# Patient Record
Sex: Female | Born: 1990 | Race: Asian | Hispanic: No | Marital: Single | Smoking: Never smoker
Health system: Southern US, Community
[De-identification: ages and names within clinical notes are randomized; demographics above are authoritative.]

## PROBLEM LIST (undated history)

## (undated) DIAGNOSIS — J45909 Unspecified asthma, uncomplicated: Secondary | ICD-10-CM

## (undated) DIAGNOSIS — E7800 Pure hypercholesterolemia, unspecified: Secondary | ICD-10-CM

## (undated) HISTORY — DX: Pure hypercholesterolemia, unspecified: E78.00

## (undated) HISTORY — PX: EYE SURGERY: SHX253

## (undated) HISTORY — PX: OTHER SURGICAL HISTORY: SHX169

## (undated) HISTORY — PX: WISDOM TOOTH EXTRACTION: SHX21

---
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
## 2012-05-10 ENCOUNTER — Emergency Department (HOSPITAL_COMMUNITY)
Admission: EM | Admit: 2012-05-10 | Discharge: 2012-05-10 | Disposition: A | Discharge status: Home / Self Care | Payer: No Typology Code available for payment source | Attending: Emergency Medicine | Admitting: Emergency Medicine

## 2012-05-10 ENCOUNTER — Emergency Department (HOSPITAL_COMMUNITY): Payer: No Typology Code available for payment source

## 2012-05-10 DIAGNOSIS — S139XXA Sprain of joints and ligaments of unspecified parts of neck, initial encounter: Secondary | ICD-10-CM | POA: Insufficient documentation

## 2012-05-10 DIAGNOSIS — Y92410 Unspecified street and highway as the place of occurrence of the external cause: Secondary | ICD-10-CM | POA: Insufficient documentation

## 2012-05-10 DIAGNOSIS — S335XXA Sprain of ligaments of lumbar spine, initial encounter: Secondary | ICD-10-CM | POA: Insufficient documentation

## 2012-05-10 DIAGNOSIS — S161XXA Strain of muscle, fascia and tendon at neck level, initial encounter: Secondary | ICD-10-CM

## 2012-05-10 DIAGNOSIS — Y9389 Activity, other specified: Secondary | ICD-10-CM | POA: Insufficient documentation

## 2012-05-10 DIAGNOSIS — S39012A Strain of muscle, fascia and tendon of lower back, initial encounter: Secondary | ICD-10-CM

## 2012-05-10 MED ORDER — IBUPROFEN 800 MG PO TABS: 800.0000 mg | ORAL_TABLET | Freq: Three times a day (TID) | ORAL | Status: DC | PRN

## 2012-05-10 MED ORDER — HYDROCODONE-ACETAMINOPHEN 5-325 MG PO TABS: 1.0000 | ORAL_TABLET | Freq: Four times a day (QID) | ORAL | Status: DC | PRN
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
## 2012-05-10 NOTE — ED Provider Notes (Signed)
Medical screening examination/treatment/procedure(s) were performed by non-physician practitioner and as supervising physician I was immediately available for consultation/collaboration.  Madoc Holquin, MD 05/10/12 2112 
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
## 2016-02-02 ENCOUNTER — Emergency Department (HOSPITAL_COMMUNITY)
Admission: EM | Admit: 2016-02-02 | Discharge: 2016-02-02 | Disposition: A | Discharge status: Home / Self Care | Payer: Managed Care, Other (non HMO) | Attending: Emergency Medicine | Admitting: Emergency Medicine

## 2016-02-02 ENCOUNTER — Encounter (HOSPITAL_COMMUNITY): Payer: Self-pay | Admitting: *Deleted

## 2016-02-02 ENCOUNTER — Emergency Department (HOSPITAL_COMMUNITY): Payer: Managed Care, Other (non HMO)

## 2016-02-02 DIAGNOSIS — R109 Unspecified abdominal pain: Secondary | ICD-10-CM | POA: Diagnosis present

## 2016-02-02 DIAGNOSIS — R1032 Left lower quadrant pain: Secondary | ICD-10-CM | POA: Insufficient documentation

## 2016-02-02 HISTORY — DX: Unspecified asthma, uncomplicated: J45.909

## 2016-02-02 LAB — COMPREHENSIVE METABOLIC PANEL
ALBUMIN: 4.3 g/dL (ref 3.5–5.0)
ALK PHOS: 53 U/L (ref 38–126)
ALT: 20 U/L (ref 14–54)
ANION GAP: 11 (ref 5–15)
AST: 23 U/L (ref 15–41)
BUN: 12 mg/dL (ref 6–20)
CALCIUM: 9 mg/dL (ref 8.9–10.3)
CO2: 21 mmol/L — AB (ref 22–32)
Chloride: 103 mmol/L (ref 101–111)
Creatinine, Ser: 0.7 mg/dL (ref 0.44–1.00)
GFR calc Af Amer: >60 mL/min (ref >60)
GFR calc non Af Amer: >60 mL/min (ref >60)
GLUCOSE: 93 mg/dL (ref 65–99)
POTASSIUM: 3.3 mmol/L — AB (ref 3.5–5.1)
SODIUM: 135 mmol/L (ref 135–145)
Total Bilirubin: 0.5 mg/dL (ref 0.3–1.2)
Total Protein: 7.1 g/dL (ref 6.5–8.1)

## 2016-02-02 LAB — CBC WITH DIFFERENTIAL/PLATELET
Basophils Absolute: 0 10*3/uL (ref 0.0–0.1)
Basophils Relative: 0 %
Eosinophils Absolute: 0.3 10*3/uL (ref 0.0–0.7)
Eosinophils Relative: 3 %
HEMATOCRIT: 40 % (ref 36.0–46.0)
HEMOGLOBIN: 13.5 g/dL (ref 12.0–15.0)
LYMPHS ABS: 4.1 10*3/uL — AB (ref 0.7–4.0)
Lymphocytes Relative: 42 %
MCH: 28.5 pg (ref 26.0–34.0)
MCHC: 33.8 g/dL (ref 30.0–36.0)
MCV: 84.4 fL (ref 78.0–100.0)
MONOS PCT: 6 %
Monocytes Absolute: 0.6 10*3/uL (ref 0.1–1.0)
NEUTROS ABS: 4.7 10*3/uL (ref 1.7–7.7)
NEUTROS PCT: 49 %
Platelets: 259 10*3/uL (ref 150–400)
RBC: 4.74 MIL/uL (ref 3.87–5.11)
RDW: 12.1 % (ref 11.5–15.5)
WBC: 9.7 10*3/uL (ref 4.0–10.5)

## 2016-02-02 LAB — LIPASE, BLOOD: Lipase: 29 U/L (ref 11–51)

## 2016-02-02 LAB — URINALYSIS, ROUTINE W REFLEX MICROSCOPIC
BILIRUBIN URINE: NEGATIVE
Glucose, UA: NEGATIVE mg/dL
HGB URINE DIPSTICK: NEGATIVE
Ketones, ur: NEGATIVE mg/dL
Leukocytes, UA: NEGATIVE
Nitrite: NEGATIVE
PH: 6.5 (ref 5.0–8.0)
Protein, ur: NEGATIVE mg/dL
SPECIFIC GRAVITY, URINE: 1.015 (ref 1.005–1.030)

## 2016-02-02 LAB — POC URINE PREG, ED: Preg Test, Ur: NEGATIVE

## 2016-02-02 MED ORDER — SODIUM CHLORIDE 0.9 % IV SOLN
1000.0000 mL | Freq: Once | INTRAVENOUS | Status: AC
  Administered 2016-02-02: 1000 mL via INTRAVENOUS

## 2016-02-02 MED ORDER — ONDANSETRON HCL 4 MG PO TABS: 4.0000 mg | ORAL_TABLET | Freq: Four times a day (QID) | ORAL | 0 refills | Status: DC | PRN

## 2016-02-02 MED ORDER — OXYCODONE-ACETAMINOPHEN 5-325 MG PO TABS: 1.0000 | ORAL_TABLET | ORAL | 0 refills | Status: DC | PRN

## 2016-02-02 MED ORDER — MORPHINE SULFATE (PF) 4 MG/ML IV SOLN
4.0000 mg | Freq: Once | INTRAVENOUS | Status: AC
  Administered 2016-02-02: 4 mg via INTRAVENOUS
  Filled 2016-02-02: qty 1

## 2016-02-02 MED ORDER — SODIUM CHLORIDE 0.9 % IV SOLN
1000.0000 mL | INTRAVENOUS | Status: DC
  Administered 2016-02-02: 1000 mL via INTRAVENOUS

## 2016-02-02 MED ORDER — ONDANSETRON HCL 4 MG/2ML IJ SOLN
4.0000 mg | Freq: Once | INTRAMUSCULAR | Status: AC
  Administered 2016-02-02: 4 mg via INTRAVENOUS
  Filled 2016-02-02: qty 2

## 2016-02-02 NOTE — ED Notes (Signed)
The pt is writhing on the stretcher in pain she  Has epigastric pain

## 2016-02-02 NOTE — ED Notes (Signed)
The pts pain is much better  

## 2016-02-02 NOTE — ED Triage Notes (Signed)
The pt has had abd and flank pain since 0300am with  lmp 3 weeks

## 2016-02-02 NOTE — ED Provider Notes (Signed)
[facility]-EMERGENCY DEPT Provider Note   CSN: [id number] Arrival date & time: 02/02/16  0341  By signing my name below, I, Clovis Pu, attest that this documentation has been prepared under the direction and in the presence of Dione Booze, MD  Electronically Signed: Clovis Pu, ED Scribe. 02/02/16. 4:03 AM.    History   Chief Complaint Chief Complaint  Patient presents with  . Abdominal Pain    The history is provided by the patient. No language interpreter was used.   HPI Comments:  Jamie Dominguez is a 25 y.o. female who presents to the Emergency Department complaining of mid abdominal pain onset 3:10AM today. Pt notes associated nausea and vomiting. She states the pain is exacerbated to the touch. Pt denies chest pain. No alleviating factors noted.   No past medical history on file.  There are no active problems to display for this patient.   No past surgical history on file.  OB History    No data available       Home Medications    Prior to Admission medications   Medication Sig Start Date End Date Taking? Authorizing Provider  cetirizine (ZYRTEC) 10 MG tablet Take 10 mg by mouth daily.    Historical Provider, MD  HYDROcodone-acetaminophen (NORCO/VICODIN) 5-325 MG per tablet Take 1 tablet by mouth every 6 (six) hours as needed for pain. 05/10/12   Charlestine Night, PA-C  ibuprofen (ADVIL,MOTRIN) 800 MG tablet Take 1 tablet (800 mg total) by mouth every 8 (eight) hours as needed for pain. 05/10/12   Charlestine Night, PA-C    Family History No family history on file.  Social History Social History  Substance Use Topics  . Smoking status: Not on file  . Smokeless tobacco: Not on file  . Alcohol use Not on file     Allergies   Review of patient's allergies indicates no known allergies.   Review of Systems Review of Systems  Cardiovascular: Negative for chest pain.  Gastrointestinal: Positive for abdominal pain, nausea and vomiting.  All other systems  reviewed and are negative.    Physical Exam Updated Vital Signs BP (!) 146/102 (BP Location: Right Arm)   Pulse 72   Temp 98.2 F (36.8 C) (Oral)   Resp 20   Ht 5\' 1"  (1.549 m)   Wt 160 lb (72.6 kg)   SpO2 100%   BMI 30.23 kg/m   Physical Exam  Constitutional: She is oriented to person, place, and time. She appears well-developed and well-nourished.  In obvious pain.  HENT:  Head: Normocephalic and atraumatic.  Eyes: EOM are normal. Pupils are equal, round, and reactive to light.  Neck: Normal range of motion. Neck supple. No JVD present.  Cardiovascular: Normal rate, regular rhythm and normal heart sounds.   No murmur heard. Pulmonary/Chest: Effort normal and breath sounds normal. She has no wheezes. She has no rales. She exhibits no tenderness.  Abdominal: She exhibits no distension and no mass. There is tenderness. There is no rebound and no guarding.  Mild tenderness Left mid and lower abdomen. No rebound or guarding. Mild CVA tenderness. Bowel sounds decreased.  Musculoskeletal: Normal range of motion. She exhibits no edema.  Lymphadenopathy:    She has no cervical adenopathy.  Neurological: She is alert and oriented to person, place, and time. No cranial nerve deficit. She exhibits normal muscle tone. Coordination normal.  Skin: Skin is warm and dry. No rash noted.  Psychiatric: She has a normal mood and affect. Her behavior is  normal. Judgment and thought content normal.  Nursing note and vitals reviewed.    ED Treatments / Results  DIAGNOSTIC STUDIES:  Oxygen Saturation is 100% on RA, normal by my interpretation.    COORDINATION OF CARE:  3:58 AM Discussed treatment plan with pt at bedside and pt agreed to plan.  Labs (all labs ordered are listed, but only abnormal results are displayed) Labs Reviewed  COMPREHENSIVE METABOLIC PANEL - Abnormal; Notable for the following:       Result Value   Potassium 3.3 (*)    CO2 21 (*)    All other components within  normal limits  CBC WITH DIFFERENTIAL/PLATELET - Abnormal; Notable for the following:    Lymphs Abs 4.1 (*)    All other components within normal limits  LIPASE, BLOOD  URINALYSIS, ROUTINE W REFLEX MICROSCOPIC (NOT [facility])  POC URINE PREG, ED    Radiology Ct Renal Stone Study  Result Date: 02/02/2016 CLINICAL DATA:  Acute onset mid abdominal pain radiating to LEFT flank and groin. History of menstrual pain. EXAM: CT ABDOMEN AND PELVIS WITHOUT CONTRAST TECHNIQUE: Multidetector CT imaging of the abdomen and pelvis was performed following the standard protocol without IV contrast. COMPARISON:  None. FINDINGS: LOWER CHEST: Lung bases are clear. The visualized heart size is normal. No pericardial effusion. HEPATOBILIARY: Normal. PANCREAS: Normal. SPLEEN: Normal. ADRENALS/URINARY TRACT: Kidneys are orthotopic, demonstrating normal size and morphology. No nephrolithiasis, hydronephrosis; limited assessment for renal masses on this nonenhanced examination. The unopacified ureters are normal in course and caliber. Urinary bladder is partially distended and unremarkable. Normal adrenal glands. STOMACH/BOWEL: The stomach, small and large bowel are normal in course and caliber without inflammatory changes, the sensitivity may be decreased by lack of enteric contrast. Normal appendix. VASCULAR/LYMPHATIC: Aortoiliac vessels are normal in course and caliber. No lymphadenopathy by CT size criteria. REPRODUCTIVE: Normal. OTHER: No intraperitoneal free fluid or free air. MUSCULOSKELETAL: Non-acute. Mild L5-S1 endplate sclerosis compatible with degenerative disc. IMPRESSION: No urolithiasis, obstructive uropathy nor acute intra-abdominal/ pelvic process. Electronically Signed   By: Awilda Metroourtnay  Bloomer M.D.   On: 02/02/2016 06:14    Procedures Procedures (including critical care time)  Medications Ordered in ED Medications  0.9 %  sodium chloride infusion (0 mLs Intravenous Stopped 02/02/16 0627)    Followed by  0.9  %  sodium chloride infusion (1,000 mLs Intravenous New Bag/Given 02/02/16 [id number])  morphine 4 MG/ML injection 4 mg (4 mg Intravenous Given 02/02/16 0421)  ondansetron (ZOFRAN) injection 4 mg (4 mg Intravenous Given 02/02/16 0421)     Initial Impression / Assessment and Plan / ED Course  I have reviewed the triage vital signs and the nursing notes.  Pertinent labs & imaging results that were available during my care of the patient were reviewed by me and considered in my medical decision making (see chart for details).  Clinical Course   Severe abdominal pain. Exam seems to localize the tenderness to the left side of the abdomen and left flank which is suspicious for renal colic. Old records are reviewed, and she had a negative abdominal ultrasound in 2005. There is been no abdominal imaging since then. She is given IV fluids, morphine, ondansetron and will be sent for renal stone protocol CT scan.  CT is unremarkable. Laboratory workup is unremarkable. Patient is now completely pain-free. Most likely explanation is kidney stone which has passed. She is discharged with prescription for a small number of oxycodone have acetaminophen and ondansetron should pain recur.  Final Clinical Impressions(s) /  ED Diagnoses   Final diagnoses:  Abdominal pain, unspecified abdominal location    New Prescriptions New Prescriptions   ONDANSETRON (ZOFRAN) 4 MG TABLET    Take 1 tablet (4 mg total) by mouth every 6 (six) hours as needed for nausea or vomiting.   OXYCODONE-ACETAMINOPHEN (PERCOCET) 5-325 MG TABLET    Take 1 tablet by mouth every 4 (four) hours as needed for moderate pain.  I personally performed the services described in this documentation, which was scribed in my presence. The recorded information has been reviewed and is accurate.       Dione Booze, MD 02/02/16 [phone]

## 2016-02-02 NOTE — ED Notes (Signed)
Family at bedside. 

## 2016-02-02 NOTE — ED Notes (Signed)
No pain

## 2016-02-02 NOTE — ED Notes (Signed)
The pt has gone to c-t and returned 

## 2016-05-10 DIAGNOSIS — R87612 Low grade squamous intraepithelial lesion on cytologic smear of cervix (LGSIL): Secondary | ICD-10-CM

## 2016-05-10 HISTORY — DX: Low grade squamous intraepithelial lesion on cytologic smear of cervix (LGSIL): R87.612

## 2017-02-08 ENCOUNTER — Other Ambulatory Visit: Payer: Self-pay | Admitting: Family Medicine

## 2017-02-08 ENCOUNTER — Other Ambulatory Visit (HOSPITAL_COMMUNITY)
Admission: RE | Admit: 2017-02-08 | Discharge: 2017-02-08 | Disposition: A | Discharge status: Home / Self Care | Payer: Managed Care, Other (non HMO) | Source: Ambulatory Visit | Attending: Family Medicine | Admitting: Family Medicine

## 2017-02-08 DIAGNOSIS — Z124 Encounter for screening for malignant neoplasm of cervix: Secondary | ICD-10-CM | POA: Insufficient documentation

## 2017-02-11 LAB — CYTOLOGY - PAP: HPV: NOT DETECTED

## 2019-04-29 ENCOUNTER — Ambulatory Visit
Admission: EM | Admit: 2019-04-29 | Discharge: 2019-04-29 | Disposition: A | Discharge status: Home / Self Care | Payer: Managed Care, Other (non HMO)

## 2019-04-29 ENCOUNTER — Other Ambulatory Visit: Payer: Self-pay

## 2019-04-29 DIAGNOSIS — Z20828 Contact with and (suspected) exposure to other viral communicable diseases: Secondary | ICD-10-CM

## 2019-04-29 DIAGNOSIS — R0981 Nasal congestion: Secondary | ICD-10-CM

## 2019-04-29 DIAGNOSIS — Z20822 Contact with and (suspected) exposure to covid-19: Secondary | ICD-10-CM

## 2019-04-29 NOTE — Discharge Instructions (Addendum)
COVID PCR testing ordered. I would like you to quarantine until testing results. You can take over the counter flonase/nasacort to help with nasal congestion/drainage. If experiencing shortness of breath, trouble breathing, go to the emergency department for further evaluation needed. 

## 2019-04-29 NOTE — ED Provider Notes (Signed)
[facility]    CSN: [id number] Arrival date & time: 04/29/19  1333      History   Chief Complaint Chief Complaint  Patient presents with  . Nasal Congestion    HPI Jamie Dominguez is a 28 y.o. female.   28 year old female comes in for 3 day of URI symptoms. Nasal congestion, cough, decrease taste/smell, fatigue. Denies fever, chills, body aches. Denies abdominal pain, nausea, vomiting, diarrhea. Has had some shortness of breath, where she feels like she cannot catch deep breaths. Albuterol with some relief. Positive COVID exposure x 3 days ago.     Past Medical History:  Diagnosis Date  . Asthma     There are no problems to display for this patient.   History reviewed. No pertinent surgical history.  OB History   No obstetric history on file.      Home Medications    Prior to Admission medications   Medication Sig Start Date End Date Taking? Authorizing Provider  albuterol (VENTOLIN HFA) 108 (90 Base) MCG/ACT inhaler Inhale into the lungs every 6 (six) hours as needed for wheezing or shortness of breath.   Yes [provider]  cetirizine (ZYRTEC) 10 MG tablet Take 10 mg by mouth daily.    [provider]    Family History History reviewed. No pertinent family history.  Social History Social History   Tobacco Use  . Smoking status: Never Smoker  . Smokeless tobacco: Never Used  Substance Use Topics  . Alcohol use: Yes  . Drug use: Not on file     Allergies   Patient has no known allergies.   Review of Systems Review of Systems  Reason unable to perform ROS: See HPI as above.     Physical Exam Triage Vital Signs ED Triage Vitals [04/29/19 1413]  Enc Vitals Group     BP 112/72     Pulse Rate 92     Resp 16     Temp 98.2 F (36.8 C)     Temp Source Oral     SpO2 99 %     Weight      Height      Head Circumference      Peak Flow      Pain Score 0     Pain Loc      Pain Edu?      Excl. in [place]?    No  data found.  Updated Vital Signs BP 112/72 (BP Location: Left Arm)   Pulse 92   Temp 98.2 F (36.8 C) (Oral)   Resp 16   LMP 04/24/2019   SpO2 99%   Visual Acuity Right Eye Distance:   Left Eye Distance:   Bilateral Distance:    Right Eye Near:   Left Eye Near:    Bilateral Near:     Physical Exam Constitutional:      General: She is not in acute distress.    Appearance: Normal appearance. She is not ill-appearing, toxic-appearing or diaphoretic.  HENT:     Head: Normocephalic and atraumatic.     Mouth/Throat:     Mouth: Mucous membranes are moist.     Pharynx: Oropharynx is clear. Uvula midline.  Cardiovascular:     Rate and Rhythm: Normal rate and regular rhythm.     Heart sounds: Normal heart sounds. No murmur. No friction rub. No gallop.   Pulmonary:     Effort: Pulmonary effort is normal. No accessory muscle usage, prolonged  expiration, respiratory distress or retractions.     Comments: Speaking in full sentences without difficulty. Lungs clear to auscultation without adventitious lung sounds. Musculoskeletal:     Cervical back: Normal range of motion and neck supple.  Neurological:     General: No focal deficit present.     Mental Status: She is alert and oriented to person, place, and time.    UC Treatments / Results  Labs (all labs ordered are listed, but only abnormal results are displayed) Labs Reviewed  NOVEL CORONAVIRUS, NAA    EKG   Radiology No results found.  Procedures Procedures (including critical care time)  Medications Ordered in UC Medications - No data to display  Initial Impression / Assessment and Plan / UC Course  I have reviewed the triage vital signs and the nursing notes.  Pertinent labs & imaging results that were available during my care of the patient were reviewed by me and considered in my medical decision making (see chart for details).  COVID PCR test ordered. Patient to quarantine until testing results return. No  alarming signs on exam.  Patient speaking in full sentences without respiratory distress.  Symptomatic treatment discussed.  Push fluids.  Return precautions given.  Patient expresses understanding and agrees to plan.  Apparently lungs are clear to auscultation bilaterally without adventitious lung sounds.  Patient to continue albuterol inhaler.  If inhaler not controlling shortness of breath, can call back for possible prednisone.  Final Clinical Impressions(s) / UC Diagnoses   Final diagnoses:  Nasal congestion   ED Prescriptions    None     PDMP not reviewed this encounter.   Belinda Fisher, PA-C 04/29/19 1435

## 2019-04-29 NOTE — ED Triage Notes (Signed)
Pt c/o nasal congestion with cough and loss of some taste and smell. States had a positive COVID exposure 3days ago

## 2019-04-30 LAB — NOVEL CORONAVIRUS, NAA: SARS-CoV-2, NAA: NOT DETECTED

## 2019-06-05 ENCOUNTER — Ambulatory Visit
Admission: EM | Admit: 2019-06-05 | Discharge: 2019-06-05 | Disposition: A | Discharge status: Home / Self Care | Payer: Managed Care, Other (non HMO) | Attending: Emergency Medicine | Admitting: Emergency Medicine

## 2019-06-05 ENCOUNTER — Encounter: Payer: Self-pay | Admitting: Emergency Medicine

## 2019-06-05 ENCOUNTER — Other Ambulatory Visit: Payer: Self-pay

## 2019-06-05 DIAGNOSIS — Z20822 Contact with and (suspected) exposure to covid-19: Secondary | ICD-10-CM | POA: Diagnosis not present

## 2019-06-05 DIAGNOSIS — J45909 Unspecified asthma, uncomplicated: Secondary | ICD-10-CM | POA: Diagnosis not present

## 2019-06-05 MED ORDER — AEROCHAMBER PLUS FLO-VU MEDIUM MISC: 1.0000 | Freq: Once | 0 refills | Status: AC

## 2019-06-05 MED ORDER — PREDNISONE 20 MG PO TABS: 20.0000 mg | ORAL_TABLET | Freq: Every day | ORAL | 0 refills | Status: AC

## 2019-06-05 MED ORDER — ALBUTEROL SULFATE HFA 108 (90 BASE) MCG/ACT IN AERS: 2.0000 | INHALATION_SPRAY | Freq: Four times a day (QID) | RESPIRATORY_TRACT | 0 refills | Status: DC | PRN

## 2019-06-05 NOTE — ED Provider Notes (Signed)
[facility]    CSN: [id number] Arrival date & time: 06/05/19  4098      History   Chief Complaint Chief Complaint  Patient presents with  . COVID-Like Symptoms    HPI Jamie Dominguez is a 29 y.o. female with history of asthma  Presenting for Covid testing: Exposure: Coworker who tested positive a week ago Date of exposure: Last week Any fever, symptoms since exposure: Yes: Headache, sore throat, dry cough, nasal congestion, fatigue and malaise since this morning.  No myalgias, productive cough, hemoptysis, chest pain, shortness of breath.  Patient does note history of seasonal allergies, for which she has been taking Zyrtec daily.  Has also noted increased use of inhaler for the last month which "happens whenever I get allergies ".  Does not currently have PCP.   Past Medical History:  Diagnosis Date  . Asthma     There are no problems to display for this patient.   History reviewed. No pertinent surgical history.  OB History   No obstetric history on file.      Home Medications    Prior to Admission medications   Medication Sig Start Date End Date Taking? Authorizing Provider  albuterol (VENTOLIN HFA) 108 (90 Base) MCG/ACT inhaler Inhale 2 puffs into the lungs every 6 (six) hours as needed for wheezing or shortness of breath. 06/05/19   Hall-Potvin, [place], PA-C  cetirizine (ZYRTEC) 10 MG tablet Take 10 mg by mouth daily.    [provider]  predniSONE (DELTASONE) 20 MG tablet Take 1 tablet (20 mg total) by mouth daily for 7 days. 06/05/19 06/12/19  Hall-Potvin, [place], PA-C  Spacer/Aero-Holding Chambers (AEROCHAMBER PLUS FLO-VU MEDIUM) MISC 1 each by Other route once for 1 dose. 06/05/19 06/05/19  Hall-Potvin, [place], PA-C    Family History Family History  Problem Relation Age of Onset  . Healthy Mother   . Healthy Father     Social History Social History   Tobacco Use  . Smoking status: Never Smoker  . Smokeless tobacco: Never Used   Substance Use Topics  . Alcohol use: Yes  . Drug use: Not on file     Allergies   Patient has no known allergies.   Review of Systems As per HPI   Physical Exam Triage Vital Signs ED Triage Vitals  Enc Vitals Group     BP 06/05/19 0949 118/80     Pulse Rate 06/05/19 0949 (!) 52     Resp 06/05/19 0949 16     Temp 06/05/19 0949 97.7 F (36.5 C)     Temp Source 06/05/19 0949 Temporal     SpO2 06/05/19 0949 99 %     Weight --      Height --      Head Circumference --      Peak Flow --      Pain Score 06/05/19 0950 6     Pain Loc --      Pain Edu? --      Excl. in GC? --    No data found.  Updated Vital Signs BP 118/80 (BP Location: Left Arm)   Pulse (!) 52   Temp 97.7 F (36.5 C) (Temporal)   Resp 16   LMP 05/24/2019   SpO2 99%   Visual Acuity Right Eye Distance:   Left Eye Distance:   Bilateral Distance:    Right Eye Near:   Left Eye Near:    Bilateral Near:     Physical Exam Constitutional:  General: She is not in acute distress.    Appearance: She is not ill-appearing or diaphoretic.  HENT:     Head: Normocephalic and atraumatic.     Mouth/Throat:     Mouth: Mucous membranes are moist.     Pharynx: Oropharynx is clear. No oropharyngeal exudate or posterior oropharyngeal erythema.  Eyes:     General: No scleral icterus.    Conjunctiva/sclera: Conjunctivae normal.     Pupils: Pupils are equal, round, and reactive to light.  Neck:     Comments: Trachea midline, negative JVD Cardiovascular:     Rate and Rhythm: Normal rate and regular rhythm.     Heart sounds: No murmur. No gallop.      Comments: HR 57-63 at bedside Pulmonary:     Effort: Pulmonary effort is normal. No respiratory distress.     Breath sounds: No wheezing, rhonchi or rales.     Comments: Decreased breath sounds bilaterally Musculoskeletal:     Cervical back: Neck supple. No tenderness.  Lymphadenopathy:     Cervical: No cervical adenopathy.  Skin:    Capillary Refill:  Capillary refill takes less than 2 seconds.     Coloration: Skin is not jaundiced or pale.     Findings: No rash.  Neurological:     General: No focal deficit present.     Mental Status: She is alert and oriented to person, place, and time.      UC Treatments / Results  Labs (all labs ordered are listed, but only abnormal results are displayed) Labs Reviewed  NOVEL CORONAVIRUS, NAA    EKG   Radiology No results found.  Procedures Procedures (including critical care time)  Medications Ordered in UC Medications - No data to display  Initial Impression / Assessment and Plan / UC Course  I have reviewed the triage vital signs and the nursing notes.  Pertinent labs & imaging results that were available during my care of the patient were reviewed by me and considered in my medical decision making (see chart for details).    I have reviewed the triage vital signs and the nursing notes.  All pertinent labs & imaging results that were available during my care of the patient were reviewed by me and considered in my medical decision making (see chart for details).  Patient afebrile, nontoxic, with SpO2 99%.  Covid PCR pending.  Patient to quarantine until results are back.  We will continue supportive management, add prednisone as adjuvant given history of asthma second to seasonal allergies.  Return precautions discussed, patient verbalized understanding and is agreeable to plan. Final Clinical Impressions(s) / UC Diagnoses   Final diagnoses:  Asthma due to seasonal allergies  Exposure to COVID-19 virus     Discharge Instructions     Your COVID test is pending - it is important to quarantine / isolate at home until your results are back. If you test positive and would like further evaluation for persistent or worsening symptoms, you may schedule an E-visit or virtual (video) visit throughout the [facility] app or website.  PLEASE NOTE: If you develop severe chest  pain or shortness of breath please go to the ER or call 9-1-1 for further evaluation --> DO NOT schedule electronic or virtual visits for this. Please call our office for further guidance / recommendations as needed.  For information about the Covid vaccine, please visit [email or web address]    ED Prescriptions    Medication Sig Dispense Auth. Provider   albuterol (VENTOLIN  HFA) 108 (90 Base) MCG/ACT inhaler Inhale 2 puffs into the lungs every 6 (six) hours as needed for wheezing or shortness of breath. 18 g Hall-Potvin, [place], PA-C   predniSONE (DELTASONE) 20 MG tablet Take 1 tablet (20 mg total) by mouth daily for 7 days. 7 tablet Hall-Potvin, [place], PA-C   Spacer/Aero-Holding Chambers (AEROCHAMBER PLUS FLO-VU MEDIUM) MISC 1 each by Other route once for 1 dose. 1 each Hall-Potvin, [place], PA-C     PDMP not reviewed this encounter.   Hall-Potvin, [place], [place] 06/05/19 1100

## 2019-06-05 NOTE — ED Triage Notes (Signed)
Pt presents to [facility] for assessment of headache, sore throat, cough, nasal congestion, fatigue, malaise starting this morning.

## 2019-06-05 NOTE — Discharge Instructions (Signed)
Your COVID test is pending - it is important to quarantine / isolate at home until your results are back. °If you test positive and would like further evaluation for persistent or worsening symptoms, you may schedule an E-visit or virtual (video) visit throughout the [facility] My[facility] app or website. ° °PLEASE NOTE: If you develop severe chest pain or shortness of breath please go to the ER or call 9-1-1 for further evaluation --> DO NOT schedule electronic or virtual visits for this. °Please call our office for further guidance / recommendations as needed. ° °For information about the Covid vaccine, please visit [email or web address] °

## 2019-06-06 LAB — NOVEL CORONAVIRUS, NAA: SARS-CoV-2, NAA: NOT DETECTED

## 2019-07-30 ENCOUNTER — Ambulatory Visit: Payer: Managed Care, Other (non HMO) | Attending: Internal Medicine

## 2019-07-30 DIAGNOSIS — Z23 Encounter for immunization: Secondary | ICD-10-CM

## 2019-07-30 NOTE — Progress Notes (Signed)
   Covid-19 Vaccination Clinic  Name:  JULISA FLIPPO    MRN: [id number] DOB: 06-Oct-1990  07/30/2019  Ms. Bagnall was observed post Covid-19 immunization for 15 minutes without incident. She was provided with Vaccine Information Sheet and instruction to access the V-Safe system.   Ms. Mccartin was instructed to call 911 with any severe reactions post vaccine: Marland Kitchen Difficulty breathing  . Swelling of face and throat  . A fast heartbeat  . A bad rash all over body  . Dizziness and weakness   Immunizations Administered    Name Date Dose VIS Date Route   Pfizer COVID-19 Vaccine 07/30/2019 10:30 AM 0.3 mL 04/20/2019 Intramuscular   Manufacturer: [employer], [employer]   Lot: [id number]   NDC: [address]-0

## 2019-08-22 ENCOUNTER — Ambulatory Visit: Payer: Managed Care, Other (non HMO) | Attending: Internal Medicine

## 2019-08-22 DIAGNOSIS — Z23 Encounter for immunization: Secondary | ICD-10-CM

## 2019-08-22 NOTE — Progress Notes (Signed)
   Covid-19 Vaccination Clinic  Name:  Jamie Dominguez    MRN: [id number] DOB: [birth year]  08/22/2019  Jamie Dominguez was observed post Covid-19 immunization for 15 minutes without incident. She was provided with Vaccine Information Sheet and instruction to access the V-Safe system.   Jamie Dominguez was instructed to call 911 with any severe reactions post vaccine: Marland Kitchen Difficulty breathing  . Swelling of face and throat  . A fast heartbeat  . A bad rash all over body  . Dizziness and weakness   Immunizations Administered    Name Date Dose VIS Date Route   Pfizer COVID-19 Vaccine 08/22/2019 11:22 AM 0.3 mL 04/20/2019 Intramuscular   Manufacturer: [employer], [employer]   Lot: [id number]   NDC: [address]-7

## 2020-05-07 ENCOUNTER — Ambulatory Visit (INDEPENDENT_AMBULATORY_CARE_PROVIDER_SITE_OTHER): Payer: Managed Care, Other (non HMO)

## 2020-05-07 ENCOUNTER — Ambulatory Visit
Admission: EM | Admit: 2020-05-07 | Discharge: 2020-05-07 | Disposition: A | Discharge status: Home / Self Care | Payer: Managed Care, Other (non HMO) | Attending: Family Medicine | Admitting: Family Medicine

## 2020-05-07 DIAGNOSIS — M5136 Other intervertebral disc degeneration, lumbar region: Secondary | ICD-10-CM

## 2020-05-07 DIAGNOSIS — M5441 Lumbago with sciatica, right side: Secondary | ICD-10-CM | POA: Diagnosis not present

## 2020-05-07 DIAGNOSIS — M5442 Lumbago with sciatica, left side: Secondary | ICD-10-CM | POA: Diagnosis not present

## 2020-05-07 MED ORDER — KETOROLAC TROMETHAMINE 30 MG/ML IJ SOLN
30.0000 mg | Freq: Once | INTRAMUSCULAR | Status: AC
  Administered 2020-05-07: 30 mg via INTRAMUSCULAR

## 2020-05-07 MED ORDER — MONTELUKAST SODIUM 10 MG PO TABS: 10.0000 mg | ORAL_TABLET | Freq: Every day | ORAL | 1 refills | Status: DC

## 2020-05-07 MED ORDER — CYCLOBENZAPRINE HCL 5 MG PO TABS: 5.0000 mg | ORAL_TABLET | Freq: Three times a day (TID) | ORAL | 0 refills | Status: DC | PRN

## 2020-05-07 MED ORDER — ALBUTEROL SULFATE HFA 108 (90 BASE) MCG/ACT IN AERS: 2.0000 | INHALATION_SPRAY | Freq: Four times a day (QID) | RESPIRATORY_TRACT | 1 refills | Status: DC | PRN

## 2020-05-07 MED ORDER — PREDNISONE 10 MG (21) PO TBPK: ORAL_TABLET | ORAL | 0 refills | Status: DC

## 2020-05-07 NOTE — Discharge Instructions (Signed)
Your x-ray showed some mild scoliosis and degenerative changes.  I believe that your symptoms are related to sciatic nerve pain.  I have given some information about this and some stretching exercises to do. For treatment we will do prednisone taper over the next 6 days.  Flexeril for muscle relaxation as needed. Rest, alternate heat and ice. Follow up as needed for continued or worsening symptoms

## 2020-05-07 NOTE — ED Triage Notes (Signed)
Pt reports having lower back pain that began last night. sts she had a back injury at the age of 89 and sts she have chronic back pain since then.  More pain when sitting. No other symptoms at this time.

## 2020-05-07 NOTE — ED Provider Notes (Signed)
[name]    CSN: [id number] Arrival date & time: 05/07/20  0940      History   Chief Complaint Chief Complaint  Patient presents with  . Back Pain    HPI Jamie Dominguez is a 29 y.o. female.   Patient is a 29 year old female who presents today with lower back pain.  This started last night.  No previous injury prior to this starting.  Started while she was at work.  Denies any specific heavy lifting or injury.  She has had chronic back pain off and on since she was a teenager.  The pain radiates into the buttocks and down both legs.  There is some associated numbness.  No weakness in the legs, loss of bowel or bladder function.  Has done heat, ice.  Has not taken any medicine for the symptoms     Past Medical History:  Diagnosis Date  . Asthma     There are no problems to display for this patient.   History reviewed. No pertinent surgical history.  OB History   No obstetric history on file.      Home Medications    Prior to Admission medications   Medication Sig Start Date End Date Taking? Authorizing Provider  cyclobenzaprine (FLEXERIL) 5 MG tablet Take 1 tablet (5 mg total) by mouth 3 (three) times daily as needed for muscle spasms. 05/07/20  Yes Ceaira Ernster A, NP  montelukast (SINGULAIR) 10 MG tablet Take 1 tablet (10 mg total) by mouth at bedtime. 05/07/20  Yes Jemuel Laursen A, NP  predniSONE (STERAPRED UNI-PAK 21 TAB) 10 MG (21) TBPK tablet 6 tabs for 1 day, then 5 tabs for 1 das, then 4 tabs for 1 day, then 3 tabs for 1 day, 2 tabs for 1 day, then 1 tab for 1 day 05/07/20  Yes Suyash Amory A, NP  albuterol (VENTOLIN HFA) 108 (90 Base) MCG/ACT inhaler Inhale 2 puffs into the lungs every 6 (six) hours as needed for wheezing or shortness of breath. 05/07/20   Dahlia Byes A, NP  cetirizine (ZYRTEC) 10 MG tablet Take 10 mg by mouth daily.    [provider]    Family History Family History  Problem Relation Age of Onset  . Healthy Mother   .  Healthy Father     Social History Social History   Tobacco Use  . Smoking status: Never Smoker  . Smokeless tobacco: Never Used  Substance Use Topics  . Alcohol use: Not Currently     Allergies   Patient has no known allergies.   Review of Systems Review of Systems   Physical Exam Triage Vital Signs ED Triage Vitals  Enc Vitals Group     BP 05/07/20 0944 106/71     Pulse Rate 05/07/20 0944 70     Resp 05/07/20 0944 16     Temp 05/07/20 0944 99 F (37.2 C)     Temp Source 05/07/20 0944 Oral     SpO2 05/07/20 0944 95 %     Weight 05/07/20 0953 170 lb (77.1 kg)     Height 05/07/20 0953 5\' 1"  (1.549 m)     Head Circumference --      Peak Flow --      Pain Score 05/07/20 0952 8     Pain Loc --      Pain Edu? --      Excl. in GC? --    No data found.  Updated Vital Signs BP  106/71 (BP Location: Right Arm)   Pulse 70   Temp 99 F (37.2 C) (Oral)   Resp 16   Ht 5\' 1"  (1.549 m)   Wt 170 lb (77.1 kg)   LMP 04/13/2020   SpO2 95%   BMI 32.12 kg/m   Visual Acuity Right Eye Distance:   Left Eye Distance:   Bilateral Distance:    Right Eye Near:   Left Eye Near:    Bilateral Near:     Physical Exam Vitals and nursing note reviewed.  Constitutional:      General: She is not in acute distress.    Appearance: Normal appearance. She is not ill-appearing, toxic-appearing or diaphoretic.  HENT:     Head: Normocephalic.     Nose: Nose normal.  Eyes:     Conjunctiva/sclera: Conjunctivae normal.  Pulmonary:     Effort: Pulmonary effort is normal.  Musculoskeletal:     Cervical back: Normal range of motion.     Lumbar back: Tenderness and bony tenderness present. No swelling. Decreased range of motion.       Back:  Skin:    General: Skin is warm and dry.     Findings: No rash.  Neurological:     Mental Status: She is alert.  Psychiatric:        Mood and Affect: Mood normal.      UC Treatments / Results  Labs (all labs ordered are listed, but only  abnormal results are displayed) Labs Reviewed - No data to display  EKG   Radiology DG Lumbar Spine Complete  Result Date: 05/07/2020 CLINICAL DATA:  Back pain and numbness. Low back pain for 1 day. Pain radiating down RIGHT leg. Intermittent pain for 12 years. EXAM: LUMBAR SPINE - COMPLETE 4+ VIEW COMPARISON:  Lumbar spine dated 05/10/2012. FINDINGS: Mild scoliosis of the thoracolumbar spine is likely accentuated by patient positioning. No evidence of acute vertebral body subluxation. No acute or suspicious osseous lesion. No fracture line or displaced fracture fragment. No evidence of pars interarticularis defect. New mild disc desiccation at the L5-S1 level with associated mild disc space narrowing and minimal osseous spurring. Remainder of the disc spaces of the lumbar spine are well preserved. Probable mild degenerative hypertrophy within the posterior elements of L5-S1. Remainder of the posterior elements of the lumbar spine appear well preserved. IMPRESSION: 1. No acute findings. 2. New mild degenerative change at the L5-S1 level, as detailed above. 3. Mild scoliosis, likely accentuated by patient positioning. Electronically Signed   By: 07/08/2012 M.D.   On: 05/07/2020 10:42    Procedures Procedures (including critical care time)  Medications Ordered in UC Medications  ketorolac (TORADOL) 30 MG/ML injection 30 mg (30 mg Intramuscular Given 05/07/20 1008)    Initial Impression / Assessment and Plan / UC Course  I have reviewed the triage vital signs and the nursing notes.  Pertinent labs & imaging results that were available during my care of the patient were reviewed by me and considered in my medical decision making (see chart for details).      Low back pain with bilateral sciatica Mild degenerative change at L5-S1 level.  Mild scoliosis. Most likely patient's pain is related to sciatic nerve inflammation.  Will treat with prednisone taper over the next 6 days.  Toradol  given for pain here today.  Flexeril for muscle relaxation as needed. Recommended alternate heat and ice. Follow up as needed for continued or worsening symptoms  Final Clinical Impressions(s) / UC  Diagnoses   Final diagnoses:  Acute midline low back pain with bilateral sciatica     Discharge Instructions     Your x-ray showed some mild scoliosis and degenerative changes.  I believe that your symptoms are related to sciatic nerve pain.  I have given some information about this and some stretching exercises to do. For treatment we will do prednisone taper over the next 6 days.  Flexeril for muscle relaxation as needed. Rest, alternate heat and ice. Follow up as needed for continued or worsening symptoms     ED Prescriptions    Medication Sig Dispense Auth. Provider   predniSONE (STERAPRED UNI-PAK 21 TAB) 10 MG (21) TBPK tablet 6 tabs for 1 day, then 5 tabs for 1 das, then 4 tabs for 1 day, then 3 tabs for 1 day, 2 tabs for 1 day, then 1 tab for 1 day 21 tablet Christy Friede A, NP   cyclobenzaprine (FLEXERIL) 5 MG tablet Take 1 tablet (5 mg total) by mouth 3 (three) times daily as needed for muscle spasms. 30 tablet Girlie Veltri A, NP   albuterol (VENTOLIN HFA) 108 (90 Base) MCG/ACT inhaler Inhale 2 puffs into the lungs every 6 (six) hours as needed for wheezing or shortness of breath. 18 g Garritt Molyneux A, NP   montelukast (SINGULAIR) 10 MG tablet Take 1 tablet (10 mg total) by mouth at bedtime. 30 tablet Dahlia Byes A, NP     PDMP not reviewed this encounter.   Janace Aris, NP 05/07/20 1157

## 2021-06-10 ENCOUNTER — Telehealth: Payer: Self-pay

## 2021-06-10 NOTE — Telephone Encounter (Signed)
[facility] referring for IUD discussion, painful cycles, prefers no cycle. Paper records in media. Called and left voicemail for patient to call back to be scheduled.

## 2021-06-15 NOTE — Telephone Encounter (Signed)
Patient is scheduled for 06/29/21 with [facility]

## 2021-06-29 ENCOUNTER — Encounter: Payer: Self-pay | Admitting: Obstetrics and Gynecology

## 2021-07-02 NOTE — Progress Notes (Signed)
Patient, No Pcp Per (Inactive)   Chief Complaint  Patient presents with   Contraception    Not sure which [facility], painful/irregular cycles    HPI:      Ms. Jamie Dominguez is a 31 y.o. No obstetric history on file. whose LMP was Patient's last menstrual period was 05/04/2021 (approximate)., presents today for NP [facility] consult for menometrorrhagia, referred by PCP. Menses are Q1-2 months, lasting 7 days, mod flow, changing products every couple hrs with clots, occas BTB if no menses that month, mod to severe dysmen (mostly in back), not improved with heating pad/NSAIDs, work/activities missed. Flow and dysmen increasing now. Has never tried [facility] before, would like to know options. Would prefer to have as few periods as possible. No hx of HTN, DVTs, migraines with aura. She is sex active, no pain/bleeding. Not on [facility].  10/18 pap with LGSIL/neg HPV DNA; repeat not done. Does STD testing yearly, would like done today.  Past Medical History:  Diagnosis Date   Asthma    Hypercholesterolemia    LGSIL on Pap smear of cervix 2018    Past Surgical History:  Procedure Laterality Date   EYE SURGERY     OTHER SURGICAL HISTORY     Gum   WISDOM TOOTH EXTRACTION      Family History  Problem Relation Age of Onset   Healthy Mother    Healthy Father     Social History   Socioeconomic History   Marital status: Single    Spouse name: Not on file   Number of children: Not on file   Years of education: Not on file   Highest education level: Not on file  Occupational History   Not on file  Tobacco Use   Smoking status: Never   Smokeless tobacco: Never  Vaping Use   Vaping Use: Never used  Substance and Sexual Activity   Alcohol use: Yes   Drug use: Never   Sexual activity: Yes    Birth control/protection: None  Other Topics Concern   Not on file  Social History Narrative   Not on file   Social Determinants of Health   Financial Resource Strain: Not on file  Food Insecurity: Not on  file  Transportation Needs: Not on file  Physical Activity: Not on file  Stress: Not on file  Social Connections: Not on file  Intimate Partner Violence: Not on file    Outpatient Medications Prior to Visit  Medication Sig Dispense Refill   albuterol (VENTOLIN HFA) 108 (90 Base) MCG/ACT inhaler Inhale 2 puffs into the lungs every 6 (six) hours as needed for wheezing or shortness of breath. 18 g 1   cetirizine (ZYRTEC) 10 MG tablet Take 10 mg by mouth daily.     Cholecalciferol (VITAMIN D3) 125 MCG (5000 UT) CAPS Take 1 capsule by mouth every morning.     cyclobenzaprine (FLEXERIL) 5 MG tablet Take 1 tablet (5 mg total) by mouth 3 (three) times daily as needed for muscle spasms. 30 tablet 0   famotidine (PEPCID) 40 MG tablet Take 40 mg by mouth daily.     montelukast (SINGULAIR) 10 MG tablet Take 1 tablet (10 mg total) by mouth at bedtime. 30 tablet 1   rosuvastatin (CRESTOR) 40 MG tablet Take 40 mg by mouth daily.     TRELEGY ELLIPTA 100-62.5-25 MCG/ACT AEPB SMARTSIG:1 Via Inhaler Every Morning     predniSONE (STERAPRED UNI-PAK 21 TAB) 10 MG (21) TBPK tablet 6 tabs for 1  day, then 5 tabs for 1 das, then 4 tabs for 1 day, then 3 tabs for 1 day, 2 tabs for 1 day, then 1 tab for 1 day 21 tablet 0   No facility-administered medications prior to visit.    ROS:  Review of Systems  Constitutional:  Negative for fever.  Gastrointestinal:  Negative for blood in stool, constipation, diarrhea, nausea and vomiting.  Genitourinary:  Negative for dyspareunia, dysuria, flank pain, frequency, hematuria, urgency, vaginal bleeding, vaginal discharge and vaginal pain.  Musculoskeletal:  Negative for back pain.  Skin:  Negative for rash.  BREAST: No symptoms   OBJECTIVE:   Vitals:  BP 102/70    Ht 5\' 2"  (1.575 m)    Wt 176 lb (79.8 kg)    LMP 05/04/2021 (Approximate)    BMI 32.19 kg/m   Physical Exam Vitals reviewed.  Constitutional:      Appearance: She is well-developed.  Pulmonary:      Effort: Pulmonary effort is normal.  Genitourinary:    General: Normal vulva.     Pubic Area: No rash.      Labia:        Right: No rash, tenderness or lesion.        Left: No rash, tenderness or lesion.      Vagina: Normal. No vaginal discharge, erythema or tenderness.     Cervix: Normal.     Uterus: Normal. Not enlarged and not tender.      Adnexa: Right adnexa normal and left adnexa normal.       Right: No mass or tenderness.         Left: No mass or tenderness.    Musculoskeletal:        General: Normal range of motion.     Cervical back: Normal range of motion.  Skin:    General: Skin is warm and dry.  Neurological:     General: No focal deficit present.     Mental Status: She is alert and oriented to person, place, and time.  Psychiatric:        Mood and Affect: Mood normal.        Behavior: Behavior normal.        Thought Content: Thought content normal.        Judgment: Judgment normal.    Assessment/Plan: Menometrorrhagia--worsening sx although flow still WNL. Discussed BC options for tx. Pt would like to consider and f/u with decision. Recommended kyleena IUD if wants IUD or try cont dosing pills/patch/ring. F/u prn.   Encounter for other general counseling or advice on contraception  LGSIL on Pap smear of cervix - Plan: Cytology - PAP; repeat pap today. Will f/u if abn.   Cervical cancer screening - Plan: Cytology - PAP  Screening for STD (sexually transmitted disease) - Plan: Cytology - PAP  Screening for HPV (human papillomavirus) - Plan: Cytology - PAP    Return if symptoms worsen or fail to improve.  Praise Dolecki B. Liyana Suniga, PA-C 07/06/2021 10:28 AM

## 2021-07-06 ENCOUNTER — Other Ambulatory Visit (HOSPITAL_COMMUNITY)
Admission: RE | Admit: 2021-07-06 | Discharge: 2021-07-06 | Disposition: A | Discharge status: Home / Self Care | Payer: Managed Care, Other (non HMO) | Source: Ambulatory Visit | Attending: Obstetrics and Gynecology | Admitting: Obstetrics and Gynecology

## 2021-07-06 ENCOUNTER — Ambulatory Visit: Payer: Managed Care, Other (non HMO) | Admitting: Obstetrics and Gynecology

## 2021-07-06 ENCOUNTER — Encounter: Payer: Self-pay | Admitting: Obstetrics and Gynecology

## 2021-07-06 ENCOUNTER — Other Ambulatory Visit: Payer: Self-pay

## 2021-07-06 VITALS — BP 102/70 | Ht 62.0 in | Wt 176.0 lb

## 2021-07-06 DIAGNOSIS — Z1151 Encounter for screening for human papillomavirus (HPV): Secondary | ICD-10-CM | POA: Diagnosis present

## 2021-07-06 DIAGNOSIS — R87612 Low grade squamous intraepithelial lesion on cytologic smear of cervix (LGSIL): Secondary | ICD-10-CM | POA: Insufficient documentation

## 2021-07-06 DIAGNOSIS — N921 Excessive and frequent menstruation with irregular cycle: Secondary | ICD-10-CM | POA: Insufficient documentation

## 2021-07-06 DIAGNOSIS — Z124 Encounter for screening for malignant neoplasm of cervix: Secondary | ICD-10-CM | POA: Insufficient documentation

## 2021-07-06 DIAGNOSIS — Z3009 Encounter for other general counseling and advice on contraception: Secondary | ICD-10-CM

## 2021-07-06 DIAGNOSIS — Z113 Encounter for screening for infections with a predominantly sexual mode of transmission: Secondary | ICD-10-CM | POA: Insufficient documentation

## 2021-07-06 NOTE — Patient Instructions (Signed)
I value your feedback and you entrusting us with your care. If you get a [facility] patient survey, I would appreciate you taking the time to let us know about your experience today. Thank you! ? ? ?

## 2021-07-07 LAB — CYTOLOGY - PAP
Chlamydia: NEGATIVE
Comment: NEGATIVE
Comment: NEGATIVE
Comment: NORMAL
Diagnosis: NEGATIVE
High risk HPV: NEGATIVE
Neisseria Gonorrhea: NEGATIVE

## 2021-09-13 IMAGING — DX DG LUMBAR SPINE COMPLETE 4+V
5 series · 5 of 5 positions shown · non-contrast
Comparison: Lumbar spine dated 05/10/2012.

CLINICAL DATA: Back pain and numbness. Low back pain for 1 day.
Pain radiating down RIGHT leg. Intermittent pain for 12 years.

EXAM:
LUMBAR SPINE - COMPLETE 4+ VIEW

[lumbar spine ap]
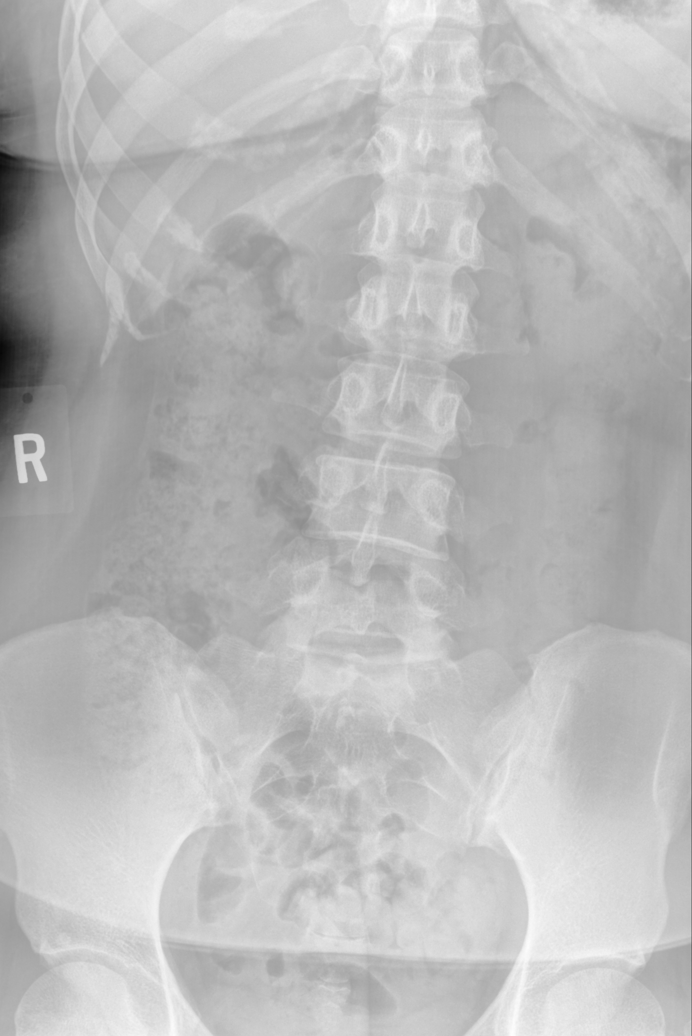

[lumbar spine lmo]
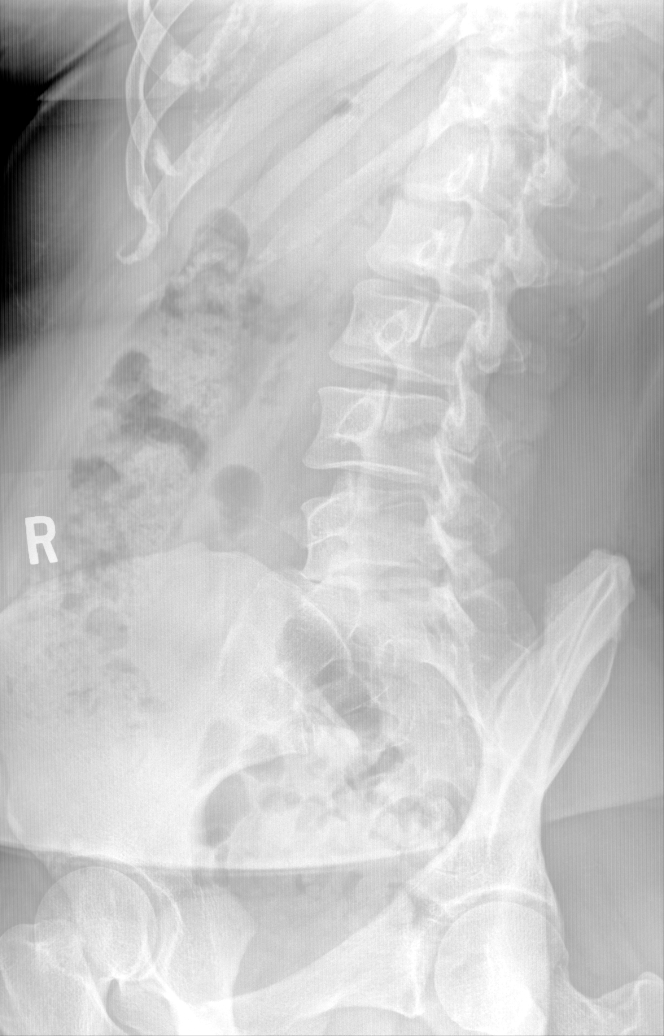

[lumbar spine mlo]
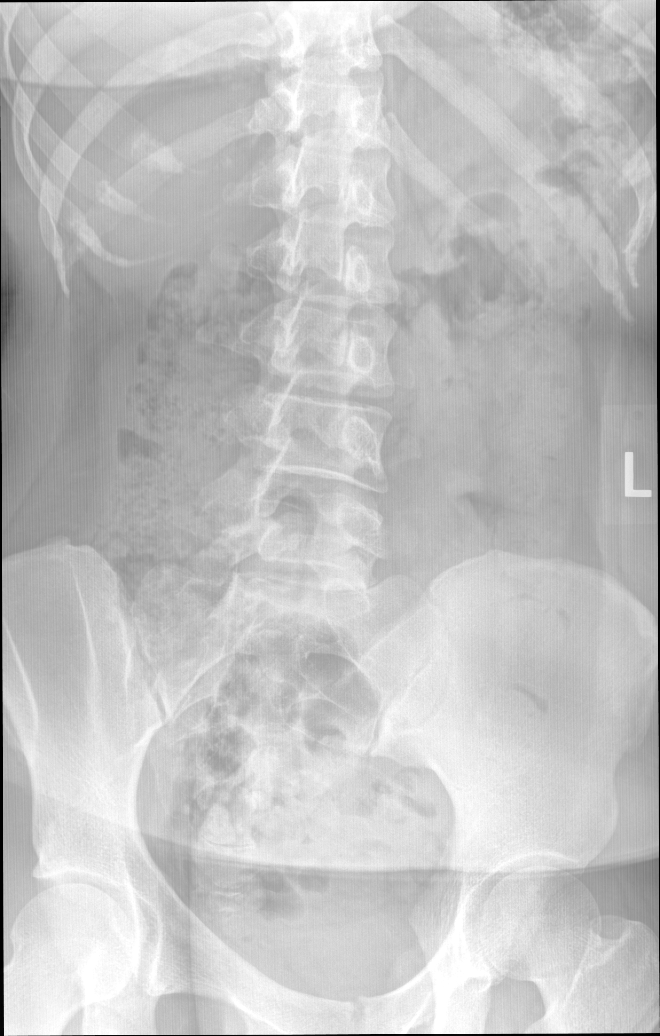

[lumbar spine lat (1 of 2)]
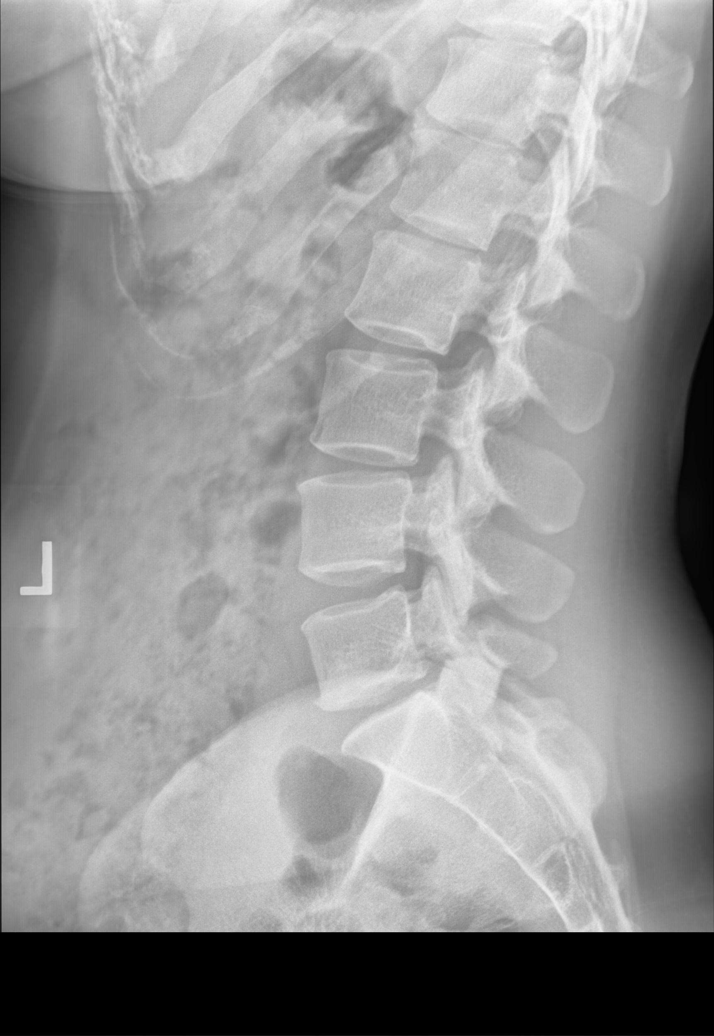

[lumbar spine lat (2 of 2)]
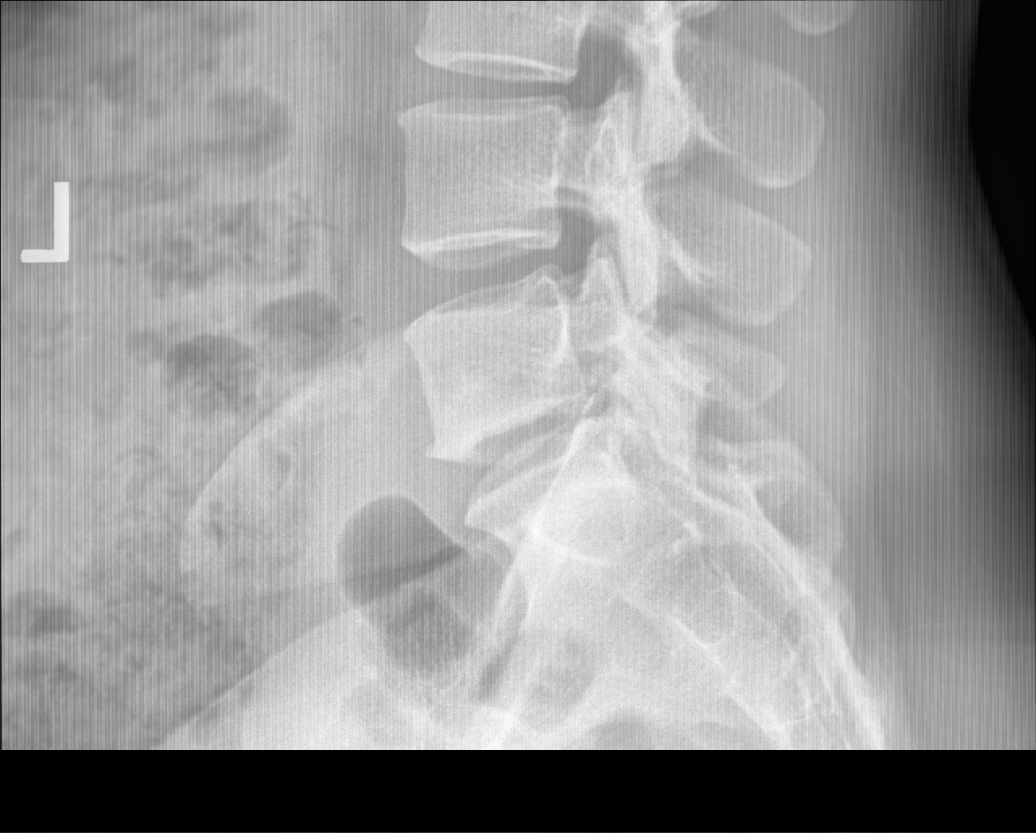

[5 of 5 positions shown; findings below may reference images not displayed]

FINDINGS: Mild scoliosis of the thoracolumbar spine is likely accentuated by
patient positioning. No evidence of acute vertebral body
subluxation. No acute or suspicious osseous lesion. No fracture line
or displaced fracture fragment. No evidence of pars interarticularis
defect.

New mild disc desiccation at the L5-S1 level with associated mild
disc space narrowing and minimal osseous spurring. Remainder of the
disc spaces of the lumbar spine are well preserved. Probable mild
degenerative hypertrophy within the posterior elements of L5-S1.
Remainder of the posterior elements of the lumbar spine appear well
preserved.
IMPRESSION: 1. No acute findings.
2. New mild degenerative change at the L5-S1 level, as detailed
above.
3. Mild scoliosis, likely accentuated by patient positioning.

## 2022-06-22 ENCOUNTER — Telehealth: Payer: Self-pay

## 2022-06-22 NOTE — Telephone Encounter (Signed)
Patient called asking for Breztri inhaler to be called into her pharmacy

## 2022-06-28 ENCOUNTER — Other Ambulatory Visit: Payer: Self-pay

## 2022-06-28 MED ORDER — BREZTRI AEROSPHERE 160-9-4.8 MCG/ACT IN AERO: 2.0000 | INHALATION_SPRAY | Freq: Two times a day (BID) | RESPIRATORY_TRACT | 3 refills | Status: AC

## 2022-07-02 ENCOUNTER — Other Ambulatory Visit: Payer: Self-pay

## 2022-07-11 ENCOUNTER — Other Ambulatory Visit: Payer: Self-pay | Admitting: Nurse Practitioner

## 2022-08-23 ENCOUNTER — Encounter: Payer: Self-pay | Admitting: Nurse Practitioner

## 2022-08-23 ENCOUNTER — Other Ambulatory Visit: Payer: Self-pay | Admitting: Nurse Practitioner

## 2022-08-23 ENCOUNTER — Other Ambulatory Visit: Payer: Managed Care, Other (non HMO)

## 2022-08-23 DIAGNOSIS — E663 Overweight: Secondary | ICD-10-CM

## 2022-08-23 DIAGNOSIS — R7301 Impaired fasting glucose: Secondary | ICD-10-CM

## 2022-08-23 DIAGNOSIS — R5383 Other fatigue: Secondary | ICD-10-CM

## 2022-08-24 ENCOUNTER — Ambulatory Visit (INDEPENDENT_AMBULATORY_CARE_PROVIDER_SITE_OTHER): Payer: Managed Care, Other (non HMO)

## 2022-08-24 ENCOUNTER — Ambulatory Visit
Admission: EM | Admit: 2022-08-24 | Discharge: 2022-08-24 | Disposition: A | Discharge status: Home / Self Care | Payer: Managed Care, Other (non HMO) | Attending: Emergency Medicine | Admitting: Emergency Medicine

## 2022-08-24 DIAGNOSIS — M25562 Pain in left knee: Secondary | ICD-10-CM

## 2022-08-24 DIAGNOSIS — M25462 Effusion, left knee: Secondary | ICD-10-CM

## 2022-08-24 LAB — LIPID PANEL
Chol/HDL Ratio: 5.3 ratio — ABNORMAL HIGH (ref 0.0–4.4)
Cholesterol, Total: 283 mg/dL — ABNORMAL HIGH (ref 100–199)
HDL: 53 mg/dL (ref >39)
LDL Chol Calc (NIH): 218 mg/dL — ABNORMAL HIGH (ref 0–99)
Triglycerides: 75 mg/dL (ref 0–149)
VLDL Cholesterol Cal: 12 mg/dL (ref 5–40)

## 2022-08-24 LAB — CMP14+EGFR
ALT: 23 IU/L (ref 0–32)
AST: 21 IU/L (ref 0–40)
Albumin/Globulin Ratio: 1.3 (ref 1.2–2.2)
Albumin: 4.3 g/dL (ref 4.0–5.0)
Alkaline Phosphatase: 82 IU/L (ref 44–121)
BUN/Creatinine Ratio: 19 (ref 9–23)
BUN: 15 mg/dL (ref 6–20)
Bilirubin Total: 0.4 mg/dL (ref 0.0–1.2)
CO2: 21 mmol/L (ref 20–29)
Calcium: 9.2 mg/dL (ref 8.7–10.2)
Chloride: 104 mmol/L (ref 96–106)
Creatinine, Ser: 0.78 mg/dL (ref 0.57–1.00)
Globulin, Total: 3.3 g/dL (ref 1.5–4.5)
Glucose: 78 mg/dL (ref 70–99)
Potassium: 3.9 mmol/L (ref 3.5–5.2)
Sodium: 139 mmol/L (ref 134–144)
Total Protein: 7.6 g/dL (ref 6.0–8.5)
eGFR: 105 mL/min/{1.73_m2} (ref >59)

## 2022-08-24 LAB — TSH: TSH: 2.36 u[IU]/mL (ref 0.450–4.500)

## 2022-08-24 LAB — HEMOGLOBIN A1C
Est. average glucose Bld gHb Est-mCnc: 111 mg/dL
Hgb A1c MFr Bld: 5.5 % (ref 4.8–5.6)

## 2022-08-24 NOTE — ED Provider Notes (Signed)
Jamie Dominguez    CSN: [id number] Arrival date & time: 08/24/22  1744      History   Chief Complaint Chief Complaint  Patient presents with   Knee Injury    Entered by patient    HPI Jamie Dominguez is a 32 y.o. female.  Patient presents with acute left knee pain today.  The pain started when she was playing tug-of-war at work; she twisted her knee and heard a pop.  No open wound, redness, bruising, numbness, weakness, or other symptoms.  Treatment with ice pack.  No OTC medications taken.  LMP: 2 months; Patient states her cycles are irregular; she denies possibility of pregnancy and declines test.    The history is provided by the patient and medical records.    Past Medical History:  Diagnosis Date   Asthma    Hypercholesterolemia    LGSIL on Pap smear of cervix 2018    Patient Active Problem List   Diagnosis Date Noted   Menometrorrhagia 07/06/2021   LGSIL on Pap smear of cervix 07/06/2021    Past Surgical History:  Procedure Laterality Date   EYE SURGERY     OTHER SURGICAL HISTORY     Gum   WISDOM TOOTH EXTRACTION      OB History     Gravida  0   Para  0   Term  0   Preterm  0   AB  0   Living  0      SAB  0   IAB  0   Ectopic  0   Multiple  0   Live Births  0            Home Medications    Prior to Admission medications   Medication Sig Start Date End Date Taking? Authorizing Provider  albuterol (VENTOLIN HFA) 108 (90 Base) MCG/ACT inhaler Inhale 2 puffs into the lungs every 6 (six) hours as needed for wheezing or shortness of breath. 05/07/20   Bast, Gloris Manchester A, NP  Budeson-Glycopyrrol-Formoterol (BREZTRI AEROSPHERE) 160-9-4.8 MCG/ACT AERO Inhale 2 puffs into the lungs in the morning and at bedtime. 06/28/22 09/26/22  Orson Eva, NP  cetirizine (ZYRTEC) 10 MG tablet Take 10 mg by mouth daily.    [provider]  Cholecalciferol (VITAMIN D3) 125 MCG (5000 UT) CAPS Take 1 capsule by mouth every morning. 01/17/21    [provider]  cyclobenzaprine (FLEXERIL) 5 MG tablet Take 1 tablet (5 mg total) by mouth 3 (three) times daily as needed for muscle spasms. 05/07/20   Dahlia Byes A, NP  famotidine (PEPCID) 40 MG tablet Take 40 mg by mouth daily. 07/01/21   [provider]  montelukast (SINGULAIR) 10 MG tablet Take 1 tablet (10 mg total) by mouth at bedtime. 05/07/20   Dahlia Byes A, NP  rosuvastatin (CRESTOR) 40 MG tablet Take 40 mg by mouth daily. 06/04/21   [provider]  venlafaxine XR (EFFEXOR-XR) 37.5 MG 24 hr capsule TAKE 1 CAPSULE BY MOUTH DAILY 07/13/22   Orson Eva, NP    Family History Family History  Problem Relation Age of Onset   Healthy Mother    Healthy Father     Social History Social History   Tobacco Use   Smoking status: Never   Smokeless tobacco: Never  Vaping Use   Vaping Use: Never used  Substance Use Topics   Alcohol use: Yes   Drug use: Never     Allergies   Patient has  no known allergies.   Review of Systems Review of Systems  Constitutional:  Negative for chills and fever.  Musculoskeletal:  Positive for arthralgias, gait problem and joint swelling.  Skin:  Negative for color change, rash and wound.  Neurological:  Negative for weakness and numbness.     Physical Exam Triage Vital Signs ED Triage Vitals  Enc Vitals Group     BP 08/24/22 1836 102/68     Pulse Rate 08/24/22 1819 95     Resp 08/24/22 1819 18     Temp 08/24/22 1819 97.6 F (36.4 C)     Temp src --      SpO2 08/24/22 1819 96 %     Weight --      Height --      Head Circumference --      Peak Flow --      Pain Score 08/24/22 1834 3     Pain Loc --      Pain Edu? --      Excl. in GC? --    No data found.  Updated Vital Signs BP 102/68 (BP Location: Left Arm)   Pulse 95   Temp 97.6 F (36.4 C)   Resp 18   LMP 06/25/2022   SpO2 96%   Visual Acuity Right Eye Distance:   Left Eye Distance:   Bilateral Distance:    Right Eye Near:   Left Eye  Near:    Bilateral Near:     Physical Exam Vitals and nursing note reviewed.  Constitutional:      General: She is not in acute distress.    Appearance: Normal appearance. She is well-developed. She is not ill-appearing.  HENT:     Mouth/Throat:     Mouth: Mucous membranes are moist.  Cardiovascular:     Rate and Rhythm: Normal rate and regular rhythm.     Heart sounds: Normal heart sounds.  Pulmonary:     Effort: Pulmonary effort is normal. No respiratory distress.     Breath sounds: Normal breath sounds.  Musculoskeletal:        General: Swelling and tenderness present. No deformity. Normal range of motion.     Cervical back: Neck supple.       Legs:  Skin:    General: Skin is warm and dry.     Capillary Refill: Capillary refill takes less than 2 seconds.     Findings: No bruising, erythema, lesion or rash.  Neurological:     Mental Status: She is alert and oriented to person, place, and time.     Sensory: No sensory deficit.     Motor: No weakness.     Gait: Gait abnormal.  Psychiatric:        Mood and Affect: Mood normal.        Behavior: Behavior normal.      UC Treatments / Results  Labs (all labs ordered are listed, but only abnormal results are displayed) Labs Reviewed - No data to display  EKG   Radiology DG Knee Complete 4 Views Left  Result Date: 08/24/2022 CLINICAL DATA:  Acute pain of left knee. Left knee pain and popping, no injury. EXAM: LEFT KNEE - COMPLETE 4+ VIEW COMPARISON:  None Available. FINDINGS: No evidence of fracture or dislocation. Normal joint spaces and alignment. There is a small knee joint effusion. No evidence of arthropathy or other focal bone abnormality. Soft tissues are unremarkable. IMPRESSION: Small knee joint effusion. No osseous abnormality. Electronically Signed  By: Narda Rutherford M.D.   On: 08/24/2022 19:26    Procedures Procedures (including critical care time)  Medications Ordered in UC Medications - No data to  display  Initial Impression / Assessment and Plan / UC Course  I have reviewed the triage vital signs and the nursing notes.  Pertinent labs & imaging results that were available during my care of the patient were reviewed by me and considered in my medical decision making (see chart for details).    Left knee pain.  Xray shows small effusion of left knee but no bony abnormality.  Treating with knee sleeve, rest, elevation, ice packs, ibuprofen or Tylenol.  Education provided on knee pain.  Instructed patient to follow-up with orthopedics.  Contact information for on-call Ortho provided.  Patient agrees to plan of care.   Final Clinical Impressions(s) / UC Diagnoses   Final diagnoses:  Acute pain of left knee  Effusion of left knee     Discharge Instructions      Take Tylenol or ibuprofen as needed.  Rest and elevate your knee.  Apply ice packs 2-3 times a day for up to 20 minutes each.  Wear the knee sleeve as needed for comfort.    Follow up with an orthopedist if your symptoms are not improving.        ED Prescriptions   None    PDMP not reviewed this encounter.   Mickie Bail, NP 08/24/22 [phone]

## 2022-08-24 NOTE — ED Triage Notes (Signed)
Patient presents to UC for left knee pain since today. Reports popping sound. Not taking any OTC meds.

## 2022-08-24 NOTE — Discharge Instructions (Addendum)
Take Tylenol or ibuprofen as needed.  Rest and elevate your knee.  Apply ice packs 2-3 times a day for up to 20 minutes each.  Wear the knee sleeve as needed for comfort.    Follow up with an orthopedist if your symptoms are not improving.    

## 2022-10-14 ENCOUNTER — Other Ambulatory Visit: Payer: Self-pay

## 2022-10-14 MED ORDER — BREZTRI AEROSPHERE 160-9-4.8 MCG/ACT IN AERO: 2.0000 | INHALATION_SPRAY | Freq: Two times a day (BID) | RESPIRATORY_TRACT | 3 refills | Status: DC

## 2023-09-29 ENCOUNTER — Other Ambulatory Visit: Payer: Self-pay

## 2023-10-31 ENCOUNTER — Ambulatory Visit: Admitting: Cardiology

## 2023-12-09 ENCOUNTER — Other Ambulatory Visit: Payer: Self-pay | Admitting: Nurse Practitioner

## 2024-03-08 ENCOUNTER — Ambulatory Visit: Payer: Self-pay

## 2024-03-08 NOTE — Telephone Encounter (Signed)
 FYI Only or Action Required?: FYI only for provider: new patient.  Patient is followed in Pulmonology for asthma, last seen on new patient.  Called Nurse Triage reporting Shortness of Breath.  Symptoms began several months ago.  Interventions attempted: Rescue inhaler and Maintenance inhaler.  Symptoms are: gradually worsening.  Triage Disposition: See PCP Within 2 Weeks  Patient/caregiver understands and will follow disposition?: Yes  Copied from CRM [id number]. Topic: Clinical - Red Word Triage >> Mar 08, 2024 11:54 AM Jamie Dominguez wrote: Red Word that prompted transfer to Nurse Triage: having more constant episodes of SOB and wheezing, last occurring 2 days ago Reason for Disposition  [1] MILD longstanding difficulty breathing (e.g., minimal/no SOB at rest, SOB with walking, pulse < 100) AND [2] SAME as normal  Answer Assessment - Initial Assessment Questions 1. RESPIRATORY STATUS: Describe your breathing? (e.g., wheezing, shortness of breath, unable to speak, severe coughing)      Wheezing, SOB with exertion 2. ONSET: When did this breathing problem begin?      Two months ago 3. PATTERN Does the difficult breathing come and go, or has it been constant since it started?      Wheezing in the evening, becomes SOB, uses inhaler for symptoms and when climbing stairs at work, begins wheezing and SOB.  Uses inhaler Inhaler less helpful now (rescue inhaler) Also has brestri 4. SEVERITY: How bad is your breathing? (e.g., mild, moderate, severe)      No symptoms at time of call 6. CARDIAC HISTORY: Do you have any history of heart disease? (e.g., heart attack, angina, bypass surgery, angioplasty)      denies 7. LUNG HISTORY: Do you have any history of lung disease?  (e.g., pulmonary embolus, asthma, emphysema)     asthma 8. CAUSE: What do you think is causing the breathing problem?      asthma 9. OTHER SYMPTOMS: Do you have any other symptoms? (e.g., chest pain, cough,  dizziness, fever, runny nose)     Cough/wheezing 10. O2 SATURATION MONITOR:  Do you use an oxygen saturation monitor (pulse oximeter) at home? If Yes, ask: What is your reading (oxygen level) today? What is your usual oxygen saturation reading? (e.g., 95%)       Not measured 11. PREGNANCY: Is there any chance you are pregnant? When was your last menstrual period?       LMP, 02/2024 12. TRAVEL: Have you traveled out of the country in the last month? (e.g., travel history, exposures)       denies  Protocols used: Breathing Difficulty-Dominguez-AH

## 2024-03-09 ENCOUNTER — Ambulatory Visit: Payer: Self-pay

## 2024-03-09 ENCOUNTER — Ambulatory Visit (INDEPENDENT_AMBULATORY_CARE_PROVIDER_SITE_OTHER)

## 2024-03-09 ENCOUNTER — Ambulatory Visit

## 2024-03-09 VITALS — BP 118/64 | HR 77 | Temp 97.8°F | Ht 62.0 in | Wt 175.4 lb

## 2024-03-09 DIAGNOSIS — J4520 Mild intermittent asthma, uncomplicated: Secondary | ICD-10-CM | POA: Diagnosis not present

## 2024-03-09 LAB — CBC WITH DIFFERENTIAL/PLATELET
Basophils Absolute: 0.1 K/uL (ref 0.0–0.1)
Basophils Relative: 1 % (ref 0.0–3.0)
Eosinophils Absolute: 0.6 K/uL (ref 0.0–0.7)
Eosinophils Relative: 8.5 % — ABNORMAL HIGH (ref 0.0–5.0)
HCT: 40.5 % (ref 36.0–46.0)
Hemoglobin: 13.6 g/dL (ref 12.0–15.0)
Lymphocytes Relative: 25 % (ref 12.0–46.0)
Lymphs Abs: 1.9 K/uL (ref 0.7–4.0)
MCHC: 33.5 g/dL (ref 30.0–36.0)
MCV: 84.5 fl (ref 78.0–100.0)
Monocytes Absolute: 0.5 K/uL (ref 0.1–1.0)
Monocytes Relative: 6.8 % (ref 3.0–12.0)
Neutro Abs: 4.4 K/uL (ref 1.4–7.7)
Neutrophils Relative %: 58.7 % (ref 43.0–77.0)
Platelets: 294 K/uL (ref 150.0–400.0)
RBC: 4.8 Mil/uL (ref 3.87–5.11)
RDW: 12.8 % (ref 11.5–15.5)
WBC: 7.5 K/uL (ref 4.0–10.5)

## 2024-03-09 MED ORDER — FLUTICASONE-SALMETEROL 115-21 MCG/ACT IN AERO: 2.0000 | INHALATION_SPRAY | Freq: Two times a day (BID) | RESPIRATORY_TRACT | 6 refills | Status: DC

## 2024-03-09 MED ORDER — ALBUTEROL SULFATE HFA 108 (90 BASE) MCG/ACT IN AERS: 1.0000 | INHALATION_SPRAY | RESPIRATORY_TRACT | 6 refills | Status: DC | PRN

## 2024-03-09 MED ORDER — AEROCHAMBER MV MISC: 0 refills | Status: AC

## 2024-03-09 NOTE — Progress Notes (Signed)
 New Patient Pulmonology Office Visit   Subjective:  Patient ID: Jamie Dominguez, female    DOB: 14-Oct-1990  MRN: [id number]  Referred by: No ref. provider found  CC:  Chief Complaint  Patient presents with   Consult    Pt states asthma has been having a flare up medication helps on the shorter end but still experience symptoms  Dry cough  No SOB currently , SOB occurs everyday     HPI Jamie Dominguez is a 33 y.o. female with history of childhood asthma, using only albuterol  as needed, who came for evaluation.  Patient reported she had asthma when she was kid, without flares when she was a teenager she had COVID at age 89 and since then she has been having some shortness of breath intermittently.  A month ago she noticed more dyspnea on exertion, wheezing, cough.  She was prescribed Breztri  and she uses it as needed.  She uses albuterol  daily and 30 minutes prior exercise.  Moderate exertion and playing volleyball, pickleball causes shortness of breath.  She works in projects. Her house may have some dust. She uses air purifier. Avoid fragrances. She has seasonal allergies, and her asthma gets mildly worst.  Interval history Mainly daytime wheezing, she had 1 episode of wheezing during the night in the last month. Intermittent cough Shortness of breath with moderate exertion. Uses albuterol  30 minutes prior exercise.  ROS As above  Allergies: Patient has no known allergies.  Current Outpatient Medications:    albuterol  (VENTOLIN  HFA) 108 (90 Base) MCG/ACT inhaler, Inhale 2 puffs into the lungs every 6 (six) hours as needed for wheezing or shortness of breath., Disp: 18 g, Rfl: 1   Budeson-Glycopyrrol-Formoterol (BREZTRI  AEROSPHERE) 160-9-4.8 MCG/ACT AERO, Inhale 2 puffs into the lungs in the morning and at bedtime., Disp: 18.7 g, Rfl: 3   cetirizine (ZYRTEC) 10 MG tablet, Take 10 mg by mouth daily., Disp: , Rfl:    Cholecalciferol (VITAMIN D3) 125 MCG (5000 UT) CAPS, Take 1 capsule  by mouth every morning. (Patient not taking: Reported on 03/09/2024), Disp: , Rfl:    cyclobenzaprine  (FLEXERIL ) 5 MG tablet, Take 1 tablet (5 mg total) by mouth 3 (three) times daily as needed for muscle spasms. (Patient not taking: Reported on 03/09/2024), Disp: 30 tablet, Rfl: 0   famotidine (PEPCID) 40 MG tablet, Take 40 mg by mouth daily. (Patient not taking: Reported on 03/09/2024), Disp: , Rfl:    montelukast  (SINGULAIR ) 10 MG tablet, Take 1 tablet (10 mg total) by mouth at bedtime. (Patient not taking: Reported on 03/09/2024), Disp: 30 tablet, Rfl: 1   rosuvastatin (CRESTOR) 40 MG tablet, Take 40 mg by mouth daily. (Patient not taking: Reported on 03/09/2024), Disp: , Rfl:    venlafaxine XR (EFFEXOR-XR) 37.5 MG 24 hr capsule, TAKE 1 CAPSULE BY MOUTH DAILY (Patient not taking: Reported on 03/09/2024), Disp: 30 capsule, Rfl: 1 Past Medical History:  Diagnosis Date   Asthma    Hypercholesterolemia    LGSIL on Pap smear of cervix 2018   Past Surgical History:  Procedure Laterality Date   EYE SURGERY     OTHER SURGICAL HISTORY     Gum   WISDOM TOOTH EXTRACTION     Family History  Problem Relation Age of Onset   Healthy Mother    Healthy Father    Social History   Socioeconomic History   Marital status: Single    Spouse name: Not on file   Number of children: Not on  file   Years of education: Not on file   Highest education level: Not on file  Occupational History   Not on file  Tobacco Use   Smoking status: Never   Smokeless tobacco: Never  Vaping Use   Vaping status: Never Used  Substance and Sexual Activity   Alcohol use: Yes   Drug use: Never   Sexual activity: Yes    Birth control/protection: None  Other Topics Concern   Not on file  Social History Narrative   Not on file   Social Drivers of Health   Financial Resource Strain: Not on file  Food Insecurity: Not on file  Transportation Needs: Not on file  Physical Activity: Not on file  Stress: Not on file   Social Connections: Not on file  Intimate Partner Violence: Not on file       Objective:  BP 118/64   Pulse 77   Temp 97.8 F (36.6 C) (Oral)   Ht 5' 2 (1.575 m) Comment: per patient  Wt 175 lb 6.4 oz (79.6 kg)   LMP  (LMP Unknown)   SpO2 95% Comment: on RA  BMI 32.08 kg/m    Physical Exam Constitutional:      Appearance: Normal appearance.  HENT:     Head: Normocephalic.  Eyes:     Extraocular Movements: Extraocular movements intact.     Pupils: Pupils are equal, round, and reactive to light.  Cardiovascular:     Rate and Rhythm: Normal rate and regular rhythm.  Pulmonary:     Effort: Pulmonary effort is normal.     Breath sounds: Normal breath sounds.     Comments: No wheezing Musculoskeletal:        General: Normal range of motion.  Neurological:     General: No focal deficit present.     Mental Status: She is alert and oriented to person, place, and time.     Diagnostic Review:       Assessment & Plan:   Assessment & Plan Intermittent asthma without complication, unspecified asthma severity 33 years old with history of childhood asthma who came for asthma evaluation.  Symptoms has been worsening over the last month, having more wheezing, cough, shortness of breath with moderate exertion.  She has been using Breztri  PRN and albuterol  daily.  I recommend the following: - Advair 2 puff a day bid as maintenance therapy - Albuterol  30 minutes prior to exercise and as needed -Spacer provided -Inhaler technique reviewed - PFT at the next appt - Part of initial asthma work up: CXR, IgE, CBC, allergy panel -RTC in 3 months or prior as need it.  Orders:   Pulmonary Function Test; Future   DG Chest 2 View; Future   CBC with Differential; Future   IgE; Future   RESPIRATORY ALLERGY PANEL REGION II W/ RFLX: [place]; Future  Device Education Patient requires education and evaluation of inhaler/nebulizer technique due to persistent  symptoms Diagnosis: asthma   Education provided: Reviewed and demonstrated proper setup, medication loading, and/or operation of albuterol  inhaler and inhaled steroid . Instructed on inhalation technique, cleaning, and maintenance. Patient/caregiver performed return-demonstration; technique evaluated and corrected as needed.  Will reassess at follow-up.    Return in about 3 months (around 06/09/2024).    Marny Patch, MD Pulmonary and Critical Care Medicine [facility]

## 2024-03-09 NOTE — Patient Instructions (Addendum)
 Dear Ms. Jamie Dominguez;  I will recommend the following for your asthma;  -Advair 2 puffs twice a day -Albuterol  inh, 30 minutes prior exercising and then as need for shortness of breath -We discussed inhaler technique -You will use a spacer with your inhalers.   I will see you in 3 months.  INHALER INSTRUCTIONS: To use the inhaler you follow these steps: Prime the inhaler according to instructions (which means waste between 1-4 doses before next use).  Follow package insert Shake the inhaler before each use Connect spacer Exhale completely by blowing all the air out of your lungs Seal your mouth around the spacer Press down on the canister then inhale SLOW and STEADY until your fill your lungs completely. Hold the breath for 6-10 seconds Gently exhale Wait 60 seconds then repeat steps 2-8. Rinse the mouth after each use to prevent thrush  Your pharmacist can also review proper technique if you have any remaining questions. Let me know if the cost is too high, your insurance may be able to recommend a more affordable option for you.    ;l

## 2024-03-13 ENCOUNTER — Ambulatory Visit: Admitting: Physician Assistant

## 2024-03-13 LAB — RESPIRATORY ALLERGY PANEL REGION II W/ RFLX: ~~LOC~~
Allergen, A. alternata, m6: 0.13 kU/L — ABNORMAL HIGH
Allergen, Cedar tree, t12: 2.16 kU/L — ABNORMAL HIGH
Allergen, Comm Silver Birch, t9: 0.36 kU/L — ABNORMAL HIGH
Allergen, Cottonwood, t14: 0.46 kU/L — ABNORMAL HIGH
Allergen, D pternoyssinus,d7: 0.33 kU/L — ABNORMAL HIGH
Allergen, Mouse Urine Protein, e78: <0.1 kU/L
Allergen, Mulberry, t76: 0.25 kU/L — ABNORMAL HIGH
Allergen, Oak,t7: 0.48 kU/L — ABNORMAL HIGH
Allergen, P. notatum, m1: 0.11 kU/L — ABNORMAL HIGH
Aspergillus fumigatus, m3: 0.31 kU/L — ABNORMAL HIGH
Bermuda Grass: 0.71 kU/L — ABNORMAL HIGH
Box Elder IgE: 0.52 kU/L — ABNORMAL HIGH
CLADOSPORIUM HERBARUM (M2) IGE: <0.1 kU/L
COMMON RAGWEED (SHORT) (W1) IGE: 0.45 kU/L — ABNORMAL HIGH
Cat Dander: 1.88 kU/L — ABNORMAL HIGH
Class: 0
Class: 0
Class: 1
Class: 1
Class: 1
Class: 1
Class: 1
Class: 2
Class: 2
Class: 2
Class: 2
Class: 2
Class: 2
Class: 2
Class: 3
Class: 3
Cockroach: 0.29 kU/L — ABNORMAL HIGH
D. farinae: 0.31 kU/L — ABNORMAL HIGH
Dog Dander: 0.18 kU/L — ABNORMAL HIGH
Elm IgE: 0.96 kU/L — ABNORMAL HIGH
IgE (Immunoglobulin E), Serum: 324 kU/L — ABNORMAL HIGH (ref <=114)
Johnson Grass: 0.83 kU/L — ABNORMAL HIGH
Pecan/Hickory Tree IgE: 15.9 kU/L — ABNORMAL HIGH
Rough Pigweed  IgE: 1.46 kU/L — ABNORMAL HIGH
Sheep Sorrel IgE: 0.73 kU/L — ABNORMAL HIGH
Timothy Grass: 14.8 kU/L — ABNORMAL HIGH

## 2024-03-13 LAB — CAT DANDER COMPONENT
E220-IgE Fel d 2: <0.1 kU/L (ref <0.10)
E228-IgE Fel d 4: <0.1 kU/L (ref <0.10)
Fel d 1 (e94) IgE: 2.32 kU/L — ABNORMAL HIGH (ref <0.10)
Fel d 7 (e231) IgE: <0.1 kU/L (ref <0.10)

## 2024-03-13 LAB — DOG DANDER COMPONENT
Can f 4(e229) IgE: <0.1 kU/L (ref <0.10)
Can f 6(e230) IgE: <0.1 kU/L (ref <0.10)
E101-IgE Can f 1: <0.1 kU/L (ref <0.10)
E102-IgE Can f 2: <0.1 kU/L (ref <0.10)
E221-IgE Can f 3: <0.1 kU/L (ref <0.10)
E226-IgE Can f 5: <0.1 kU/L (ref <0.10)

## 2024-03-13 LAB — INTERPRETATION:

## 2024-03-28 ENCOUNTER — Other Ambulatory Visit: Payer: Self-pay

## 2024-03-28 MED ORDER — PREDNISONE 10 MG PO TABS: ORAL_TABLET | ORAL | 0 refills | Status: AC

## 2024-05-18 ENCOUNTER — Ambulatory Visit: Admitting: Physician Assistant

## 2024-06-06 NOTE — Progress Notes (Signed)
 "  New Patient Pulmonology Office Visit   Subjective:  Patient ID: Jamie Dominguez, female    DOB: July 02, 1990  MRN: [id number]  Referred by: No ref. provider found  CC:  No chief complaint on file.   HPI Jamie Dominguez is a 34 y.o. female with history of childhood asthma, using only albuterol  as needed, who came for evaluation.  Patient reported she had asthma when she was kid, without flares when she was a teenager she had COVID at age 6 and since then she has been having some shortness of breath intermittently.  A month ago she noticed more dyspnea on exertion, wheezing, cough.  She was prescribed Breztri  and she uses it as needed.  She uses albuterol  daily and 30 minutes prior exercise.  Moderate exertion and playing volleyball, pickleball causes shortness of breath.  She works in projects. Her house may have some dust. She uses air purifier. Avoid fragrances. She has seasonal allergies, and her asthma gets mildly worst.  Interval history Mainly daytime wheezing, she had 1 episode of wheezing during the night in the last month. Intermittent cough Shortness of breath with moderate exertion. Uses albuterol  30 minutes prior exercise.  Wheezing better > no night symptoms No cough  Sob - mainly sedenatury  b ROS As above  Allergies: Patient has no known allergies.  Current Outpatient Medications:    albuterol  (VENTOLIN  HFA) 108 (90 Base) MCG/ACT inhaler, Inhale 1-2 puffs into the lungs every 4 (four) hours as needed for wheezing or shortness of breath., Disp: 18 g, Rfl: 6   cetirizine (ZYRTEC) 10 MG tablet, Take 10 mg by mouth daily., Disp: , Rfl:    Cholecalciferol (VITAMIN D3) 125 MCG (5000 UT) CAPS, Take 1 capsule by mouth every morning. (Patient not taking: Reported on 03/09/2024), Disp: , Rfl:    cyclobenzaprine  (FLEXERIL ) 5 MG tablet, Take 1 tablet (5 mg total) by mouth 3 (three) times daily as needed for muscle spasms. (Patient not taking: Reported on 03/09/2024), Disp: 30  tablet, Rfl: 0   famotidine (PEPCID) 40 MG tablet, Take 40 mg by mouth daily. (Patient not taking: Reported on 03/09/2024), Disp: , Rfl:    fluticasone -salmeterol (ADVAIR HFA) 115-21 MCG/ACT inhaler, Inhale 2 puffs into the lungs 2 (two) times daily., Disp: 1 each, Rfl: 6   montelukast  (SINGULAIR ) 10 MG tablet, Take 1 tablet (10 mg total) by mouth at bedtime. (Patient not taking: Reported on 03/09/2024), Disp: 30 tablet, Rfl: 1   rosuvastatin (CRESTOR) 40 MG tablet, Take 40 mg by mouth daily. (Patient not taking: Reported on 03/09/2024), Disp: , Rfl:    Spacer/Aero-Holding Chambers (AEROCHAMBER MV) inhaler, Use as instructed, Disp: 1 each, Rfl: 0   venlafaxine XR (EFFEXOR-XR) 37.5 MG 24 hr capsule, TAKE 1 CAPSULE BY MOUTH DAILY (Patient not taking: Reported on 03/09/2024), Disp: 30 capsule, Rfl: 1 Past Medical History:  Diagnosis Date   Asthma    Hypercholesterolemia    LGSIL on Pap smear of cervix 2018   Past Surgical History:  Procedure Laterality Date   EYE SURGERY     OTHER SURGICAL HISTORY     Gum   WISDOM TOOTH EXTRACTION     Family History  Problem Relation Age of Onset   Healthy Mother    Healthy Father    Social History   Socioeconomic History   Marital status: Single    Spouse name: Not on file   Number of children: Not on file   Years of education: Not on file  Highest education level: Not on file  Occupational History   Not on file  Tobacco Use   Smoking status: Never   Smokeless tobacco: Never  Vaping Use   Vaping status: Never Used  Substance and Sexual Activity   Alcohol use: Yes   Drug use: Never   Sexual activity: Yes    Birth control/protection: None  Other Topics Concern   Not on file  Social History Narrative   Not on file   Social Drivers of Health   Tobacco Use: Low Risk (03/09/2024)   Patient History    Smoking Tobacco Use: Never    Smokeless Tobacco Use: Never    Passive Exposure: Not on file  Financial Resource Strain: Not on file   Food Insecurity: Not on file  Transportation Needs: Not on file  Physical Activity: Not on file  Stress: Not on file  Social Connections: Not on file  Intimate Partner Violence: Not on file  Depression (EYV7-0): Not on file  Alcohol Screen: Not on file  Housing: Not on file  Utilities: Not on file  Health Literacy: Not on file       Objective:  There were no vitals taken for this visit.   Physical Exam Constitutional:      Appearance: Normal appearance.  HENT:     Head: Normocephalic.  Eyes:     Extraocular Movements: Extraocular movements intact.     Pupils: Pupils are equal, round, and reactive to light.  Cardiovascular:     Rate and Rhythm: Normal rate and regular rhythm.  Pulmonary:     Effort: Pulmonary effort is normal.     Breath sounds: Normal breath sounds.     Comments: No wheezing Musculoskeletal:        General: Normal range of motion.  Neurological:     General: No focal deficit present.     Mental Status: She is alert and oriented to person, place, and time.     Diagnostic Review:  02/2024 Respiratory allergy  test: positive for multiple triggers IgE 324 Eos 300>600  CXR 03/08/2024 -  normal    Assessment & Plan:   Assessment & Plan  Intermittent asthma without complication, unspecified asthma severity 34 years old with history of childhood asthma who came for asthma evaluation.  Symptoms has been worsening over the last month, having more wheezing, cough, shortness of breath with moderate exertion.  She has been using Breztri  PRN and albuterol  daily.   I recommend the following: - Advair 2 puff a day bid as maintenance therapy - Albuterol  30 minutes prior to exercise and as needed -Spacer provided -Inhaler technique reviewed - PFT at the next appt - Part of initial asthma work up: CXR, IgE, CBC, allergy  panel -RTC in 3 months or prior as need  Aluteorl ocnce every other week   No follow-ups on file.    Marny Patch, MD Pulmonary  and Critical Care Medicine [facility]  "

## 2024-06-08 ENCOUNTER — Ambulatory Visit: Admitting: *Deleted

## 2024-06-08 ENCOUNTER — Ambulatory Visit

## 2024-06-08 VITALS — BP 94/62 | HR 80 | Temp 97.6°F | Ht 62.0 in | Wt 173.0 lb

## 2024-06-08 DIAGNOSIS — J45998 Other asthma: Secondary | ICD-10-CM

## 2024-06-08 DIAGNOSIS — J4520 Mild intermittent asthma, uncomplicated: Secondary | ICD-10-CM

## 2024-06-08 LAB — PULMONARY FUNCTION TEST
DL/VA % pred: 96 %
DL/VA: 4.48 ml/min/mmHg/L
DLCO cor % pred: 87 %
DLCO cor: 18.18 ml/min/mmHg
DLCO unc % pred: 87 %
DLCO unc: 18.18 ml/min/mmHg
FEF 25-75 Post: 3.71 L/s
FEF 25-75 Pre: 2.82 L/s
FEF2575-%Change-Post: 31 %
FEF2575-%Pred-Post: 111 %
FEF2575-%Pred-Pre: 85 %
FEV1-%Change-Post: 7 %
FEV1-%Pred-Post: 92 %
FEV1-%Pred-Pre: 85 %
FEV1-Post: 2.75 L
FEV1-Pre: 2.56 L
FEV1FVC-%Change-Post: 2 %
FEV1FVC-%Pred-Pre: 98 %
FEV6-%Change-Post: 4 %
FEV6-%Pred-Post: 92 %
FEV6-%Pred-Pre: 88 %
FEV6-Post: 3.22 L
FEV6-Pre: 3.09 L
FEV6FVC-%Pred-Post: 100 %
FEV6FVC-%Pred-Pre: 100 %
FVC-%Change-Post: 4 %
FVC-%Pred-Post: 91 %
FVC-%Pred-Pre: 87 %
FVC-Post: 3.24 L
FVC-Pre: 3.09 L
Post FEV1/FVC ratio: 85 %
Post FEV6/FVC ratio: 100 %
Pre FEV1/FVC ratio: 83 %
Pre FEV6/FVC Ratio: 100 %
RV % pred: 103 %
RV: 1.35 L
TLC % pred: 92 %
TLC: 4.4 L

## 2024-06-08 MED ORDER — FLUTICASONE-SALMETEROL 115-21 MCG/ACT IN AERO: 2.0000 | INHALATION_SPRAY | Freq: Two times a day (BID) | RESPIRATORY_TRACT | 6 refills | Status: AC

## 2024-06-08 MED ORDER — ALBUTEROL SULFATE HFA 108 (90 BASE) MCG/ACT IN AERS: 1.0000 | INHALATION_SPRAY | RESPIRATORY_TRACT | 6 refills | Status: AC | PRN

## 2024-06-08 NOTE — Patient Instructions (Signed)
 Full PFT performed today.

## 2024-06-08 NOTE — Progress Notes (Signed)
 Full PFT performed today.

## 2024-10-08 ENCOUNTER — Ambulatory Visit
